# Patient Record
Sex: Female | Born: 1995 | Marital: Single | Smoking: Never smoker
Health system: Northeastern US, Academic
[De-identification: ages and names within clinical notes are randomized; demographics above are authoritative.]

## PROBLEM LIST (undated history)

## (undated) HISTORY — PX: FRACTURE SURGERY: SHX138

## (undated) HISTORY — PX: LEG SURGERY: SHX1003

## (undated) NOTE — ED Provider Notes (Signed)
Formatting of this note is different from the original.  Images from the original note were not included.    [facility]  Provider Note    CSN: [id number]  Arrival date & time: 12/17/18  2113    History    Chief Complaint  Chief Complaint   Patient presents with   ? Vaginal Pain     HPI  Brittney Dougherty is a 9 y.o. female presenting for evaluation of vaginal pain.    Patient states for the past 2 days, she has been having pain at the entrance to her vagina.  This began after she had vigorous sexual intercourse with her partner using a strap on.  Patient reports she is also having vaginal discharge.  She denies fevers, chills, nausea, vomiting, abdominal pain.  She denies urinary symptoms or abnormal bowel movements.  She denies history of similar.  Patient's last period was 2 months ago, but she recently receiving the Depakote shot, and her periods have not become regular.  Patient states she is sexually active with one female partner who is symptom free. She has not taken anything for her sxs. They care constant, nothing makes them better or worse.     HPI    History reviewed. No pertinent past medical history.    There are no active problems to display for this patient.    Past Surgical History:   Procedure Laterality Date   ? LEG SURGERY Left     ORIF with hardware due to fx       OB History    No obstetric history on file.      Home Medications      Prior to Admission medications    Medication Sig Start Date End Date Taking? Authorizing Provider   cephALEXin (KEFLEX) 500 MG capsule Take 1 capsule (500 mg total) by mouth 2 (two) times daily. 12/23/16   Doristine Devoid, PA-C   doxycycline (VIBRAMYCIN) 100 MG capsule Take 1 capsule (100 mg total) by mouth 2 (two) times daily. 12/23/16   Doristine Devoid, PA-C   methocarbamol (ROBAXIN) 500 MG tablet Take 1 tablet (500 mg total) by mouth 2 (two) times daily. 12/27/16   Muthersbaugh, Jarrett Soho, PA-C   omeprazole (PRILOSEC) 20 MG capsule  Take 1 capsule (20 mg total) by mouth daily. 12/23/16   Doristine Devoid, PA-C   ondansetron (ZOFRAN ODT) 4 MG disintegrating tablet 4mg  ODT q4 hours prn nausea/vomit 12/27/16   Muthersbaugh, Jarrett Soho, PA-C   ondansetron (ZOFRAN) 4 MG tablet Take 1 tablet (4 mg total) by mouth every 6 (six) hours. 12/23/16   Doristine Devoid, PA-C   sucralfate (CARAFATE) 1 GM/10ML suspension Take 10 mLs (1 g total) by mouth 4 (four) times daily -  with meals and at bedtime. 12/23/16   Doristine Devoid, PA-C     Family History  No family history on file.    Social History  Social History     Tobacco Use   ? Smoking status: Current Some Day Smoker   ? Smokeless tobacco: Never Used   Substance Use Topics   ? Alcohol use: Yes     Comment: occ   ? Drug use: Yes     Types: Marijuana     Allergies    Patient has no known allergies.    Review of Systems  Review of Systems   Genitourinary: Positive for vaginal discharge and vaginal pain.   All other systems reviewed and are negative.  Physical Exam  Updated Vital Signs  BP 135/73 (BP Location: Left Arm)   Pulse 86   Temp 98.2 F (36.8 C) (Oral)   Resp 18   Ht 5\' 4"  (1.626 m)   Wt 73 kg   LMP  (Approximate) Comment: Nov 2019-no depo inj that was due   SpO2 99%   BMI 27.64 kg/m     Physical Exam  Vitals signs and nursing note reviewed. Exam conducted with a chaperone present.   Constitutional:       General: She is not in acute distress.     Appearance: She is well-developed.      Comments: Young female laying comfortably in the bed in no acute distress   HENT:      Head: Normocephalic and atraumatic.   Eyes:      Conjunctiva/sclera: Conjunctivae normal.      Pupils: Pupils are equal, round, and reactive to light.   Neck:      Musculoskeletal: Normal range of motion and neck supple.   Cardiovascular:      Rate and Rhythm: Normal rate and regular rhythm.   Pulmonary:      Effort: Pulmonary effort is normal. No respiratory distress.      Breath sounds: Normal breath sounds. No  wheezing.   Abdominal:      General: There is no distension.      Palpations: Abdomen is soft.      Tenderness: There is no abdominal tenderness.      Comments: No tenderness palpation the abdomen.  Soft without rigidity, guarding or distention.  Negative rebound.   Genitourinary:     Cervix: Discharge present.      Uterus: Normal.       Adnexa: Right adnexa normal and left adnexa normal.      Comments: Chaperone present.  Swelling and tenderness of the inner labia.  No obvious tears or bruising.  No lesions noted.  Thin white discharge noted coming from the cervix with clumping white discharge adhered to the surface of the cervix.  No friability.  No CMT or adnexal tenderness.  Musculoskeletal: Normal range of motion.   Skin:     General: Skin is warm and dry.      Capillary Refill: Capillary refill takes less than 2 seconds.   Neurological:      Mental Status: She is alert and oriented to person, place, and time.     ED Treatments / Results   Labs  (all labs ordered are listed, but only abnormal results are displayed)  Labs Reviewed   WET PREP, GENITAL - Abnormal; Notable for the following components:       Result Value    WBC, Wet Prep HPF POC MANY (*)     All other components within normal limits   URINALYSIS, ROUTINE W REFLEX MICROSCOPIC - Abnormal; Notable for the following components:    APPearance CLOUDY (*)     Specific Gravity, Urine >1.030 (*)     Hgb urine dipstick TRACE (*)     Leukocytes, UA SMALL (*)     All other components within normal limits   URINALYSIS, MICROSCOPIC (REFLEX) - Abnormal; Notable for the following components:    Bacteria, UA MANY (*)     All other components within normal limits   PREGNANCY, URINE   GC/CHLAMYDIA PROBE AMP ([facility]) NOT AT [facility]     EKG  None    Radiology  No results found.    Procedures  Procedures (  including critical care time)    Medications Ordered in ED  Medications   cefTRIAXone (ROCEPHIN) injection 250 mg (250 mg Intramuscular Given 12/17/18 2319)    azithromycin (ZITHROMAX) tablet 1,000 mg (1,000 mg Oral Given 12/17/18 2320)     Initial Impression / Assessment and Plan / ED Course   I have reviewed the triage vital signs and the nursing notes.    Pertinent labs & imaging results that were available during my care of the patient were reviewed by me and considered in my medical decision making (see chart for details).        Presenting for evaluation of vaginal pain and irritation.  Physical exam reassuring, she is afebrile not tachycardic and appears nontoxic.  No abdominal tenderness.  On exam, patient without CMT or adnexal tenderness, low suspicion for PID, TOA, or torsion.  In her labia are swollen and irritated, consider trauma/from sex.  Patient also has significant discharge, will send for wet prep, gonorrhea, and chlamydia.    Urine with small leuks and many bacteria, but no nitrates.  Patient without urinary symptoms.  Wet prep negative for yeast, trichomoniasis, clue cells.  Shows many white cells.  As such, consider possible gonorrhea or chlamydia.  Also consider physiologic discharge.  Discussed findings with patient, who looks for treatment for gonorrhea and chlamydia today.  Discussed results will come back in several days, she will receive a phone call for positive.  If positive, she will need to encourage her partner to get treatment.  Encouraged tylenol, ibuprofen, cool compresses to help with pain and swelling of the labia.  Also discussed that this may be early signs of herpes, she develops lesions or ulcerations, she should follow-up with OB/GYN.  Encouraged follow-up if symptoms not improving.  At this time, patient appears safe for discharge.  Return precautions given.  Patient states she understands and agrees to plan.    Final Clinical Impressions(s) / ED Diagnoses     Final diagnoses:   Vaginal irritation   Vaginal discharge     ED Discharge Orders     None         Franchot Heidelberg, PA-C  12/18/18 0009      Maudie Flakes,  MD  12/18/18 1608    Electronically signed by Maudie Flakes, MD at 12/18/2018  4:08 PM EST    Associated attestation - Maudie Flakes, MD - 12/18/2018  4:08 PM EST  Formatting of this note might be different from the original.  Attestation: Medical screening examination/treatment/procedure(s) were performed by non-physician practitioner and as supervising physician I was immediately available for consultation/collaboration.    EKG: None

## (undated) NOTE — Progress Notes (Signed)
Formatting of this note might be different from the original.  Received message from Herschell Dimes that this student is cleared and does not need follow up  Electronically signed by Linzie Collin, RN at 10/01/2019 12:49 PM EDT

## (undated) NOTE — ED Triage Notes (Signed)
Formatting of this note might be different from the original.  C/o vaginal pain and d/c x 2 days-NAD-steady gait  Electronically signed by Andrey Spearman, RN at 12/17/2018  9:21 PM EST

---
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
## 2016-12-13 ENCOUNTER — Other Ambulatory Visit: Payer: Self-pay | Admitting: Student in an Organized Health Care Education/Training Program

## 2016-12-22 ENCOUNTER — Emergency Department (HOSPITAL_BASED_OUTPATIENT_CLINIC_OR_DEPARTMENT_OTHER)
Admission: EM | Admit: 2016-12-22 | Discharge: 2016-12-23 | Disposition: A | Discharge status: Home / Self Care | Payer: BLUE CROSS/BLUE SHIELD | Attending: Emergency Medicine | Admitting: Emergency Medicine

## 2016-12-22 ENCOUNTER — Encounter (HOSPITAL_BASED_OUTPATIENT_CLINIC_OR_DEPARTMENT_OTHER): Payer: Self-pay | Admitting: Emergency Medicine

## 2016-12-22 ENCOUNTER — Emergency Department (HOSPITAL_BASED_OUTPATIENT_CLINIC_OR_DEPARTMENT_OTHER): Payer: BLUE CROSS/BLUE SHIELD

## 2016-12-22 DIAGNOSIS — N898 Other specified noninflammatory disorders of vagina: Secondary | ICD-10-CM | POA: Insufficient documentation

## 2016-12-22 DIAGNOSIS — N3001 Acute cystitis with hematuria: Secondary | ICD-10-CM | POA: Diagnosis not present

## 2016-12-22 DIAGNOSIS — M545 Low back pain, unspecified: Secondary | ICD-10-CM

## 2016-12-22 DIAGNOSIS — R110 Nausea: Secondary | ICD-10-CM

## 2016-12-22 DIAGNOSIS — R1013 Epigastric pain: Secondary | ICD-10-CM | POA: Diagnosis not present

## 2016-12-22 LAB — URINALYSIS, ROUTINE W REFLEX MICROSCOPIC
BILIRUBIN URINE: NEGATIVE
Glucose, UA: NEGATIVE mg/dL
KETONES UR: NEGATIVE mg/dL
Nitrite: NEGATIVE
PROTEIN: NEGATIVE mg/dL
Specific Gravity, Urine: 1.026 (ref 1.005–1.030)
pH: 6 (ref 5.0–8.0)

## 2016-12-22 LAB — COMPREHENSIVE METABOLIC PANEL
ALBUMIN: 4.2 g/dL (ref 3.5–5.0)
ALK PHOS: 37 U/L — AB (ref 38–126)
ALT: 10 U/L — AB (ref 14–54)
AST: 26 U/L (ref 15–41)
Anion gap: 7 (ref 5–15)
BILIRUBIN TOTAL: 0.4 mg/dL (ref 0.3–1.2)
BUN: 11 mg/dL (ref 6–20)
CALCIUM: 8.9 mg/dL (ref 8.9–10.3)
CO2: 24 mmol/L (ref 22–32)
CREATININE: 0.82 mg/dL (ref 0.44–1.00)
Chloride: 106 mmol/L (ref 101–111)
GFR calc Af Amer: >60 mL/min (ref >60)
GFR calc non Af Amer: >60 mL/min (ref >60)
GLUCOSE: 87 mg/dL (ref 65–99)
Potassium: 3.8 mmol/L (ref 3.5–5.1)
SODIUM: 137 mmol/L (ref 135–145)
TOTAL PROTEIN: 7.2 g/dL (ref 6.5–8.1)

## 2016-12-22 LAB — WET PREP, GENITAL
Clue Cells Wet Prep HPF POC: NONE SEEN
SPERM: NONE SEEN
TRICH WET PREP: NONE SEEN
YEAST WET PREP: NONE SEEN

## 2016-12-22 LAB — CBC
HCT: 38 % (ref 36.0–46.0)
Hemoglobin: 12.9 g/dL (ref 12.0–15.0)
MCH: 31 pg (ref 26.0–34.0)
MCHC: 33.9 g/dL (ref 30.0–36.0)
MCV: 91.3 fL (ref 78.0–100.0)
Platelets: 214 10*3/uL (ref 150–400)
RBC: 4.16 MIL/uL (ref 3.87–5.11)
RDW: 12.3 % (ref 11.5–15.5)
WBC: 6.4 10*3/uL (ref 4.0–10.5)

## 2016-12-22 LAB — URINALYSIS, MICROSCOPIC (REFLEX)

## 2016-12-22 LAB — LIPASE, BLOOD: Lipase: 26 U/L (ref 11–51)

## 2016-12-22 LAB — PREGNANCY, URINE: PREG TEST UR: NEGATIVE

## 2016-12-22 MED ORDER — ONDANSETRON HCL 4 MG/2ML IJ SOLN
4.0000 mg | Freq: Once | INTRAMUSCULAR | Status: AC | PRN
  Administered 2016-12-22: 4 mg via INTRAVENOUS
  Filled 2016-12-22: qty 2

## 2016-12-22 MED ORDER — GI COCKTAIL ~~LOC~~
30.0000 mL | Freq: Once | ORAL | Status: AC
  Administered 2016-12-22: 30 mL via ORAL
  Filled 2016-12-22: qty 30

## 2016-12-22 MED ORDER — MORPHINE SULFATE (PF) 4 MG/ML IV SOLN
4.0000 mg | Freq: Once | INTRAVENOUS | Status: AC
  Administered 2016-12-22: 4 mg via INTRAVENOUS
  Filled 2016-12-22: qty 1

## 2016-12-22 MED ORDER — SODIUM CHLORIDE 0.9 % IV BOLUS (SEPSIS)
1000.0000 mL | Freq: Once | INTRAVENOUS | Status: AC
  Administered 2016-12-22: 1000 mL via INTRAVENOUS

## 2016-12-22 NOTE — ED Triage Notes (Signed)
Lower back pain x 4 months and fell on Friday . Was eval by MD for back pain and was told had muscle spasms. Epigastric pain x 2 days with nausea.

## 2016-12-22 NOTE — ED Provider Notes (Signed)
[facility]   CSN: [id number] Arrival date & time: 12/22/16  1934  By signing my name below, I, Octavia Heir, attest that this documentation has been prepared under the direction and in the presence of Rise Mu, PA-C.  Electronically Signed: Octavia Heir, ED Scribe. 12/22/16. 10:36 PM.    History   Chief Complaint Chief Complaint  Patient presents with  . Abdominal Pain   The history is provided by the patient. No language interpreter was used.   HPI Comments: Kim Frederick is a 21 y.o. female who presents to the Emergency Department presenting with multiple complaints. She expresses waxing and waning epigastric abdominal pain x 2 days. She describes her pain as a sharp sensation. There are no modifying or aggravating factors for her abdominal pain. Pt has no abdominal surgical hx. Pain is not associated with food. Nothing makes better or worse. She has not tried anything for the pain at home. Patient denies any fever, chills, headache, vision changes, lightheadedness, dizziness, chest pain, shortness of breath, change in bowel habits, numbness/tingling.  She further reports associated recurrent, chronic lower back pain x 4 months that was exacerbated after a fall occurred 3 days ago. She was seen by her MD in her hometown was was diagnosed with muscle spasms prior to her fall. Pt has been taking 3 ibuprofen q4h for the past 3 days without any relief. She expresses that the pain will be constant in her lower back and then radiate into her thoracic region. She denies diarrhea, blood in stool, melena, fever, vomiting, nocturnal hyperhidrosis, bladder or bowel incontinence, saddle paresthesias. Pt further denies IV drug use.   Pt also complains of abnormal vaginal discharge x 1 week. She reports associated urinary frequency and urinary urgency. Pt states her vaginal discharge is yellow which is abnormal. Pt is currently sexually active with one  partner and notes using protection every time with intercourse. She has no concern for STD exposure. She denies abnormal vaginal bleeding, hematuria and dysuria.  History reviewed. No pertinent past medical history.  There are no active problems to display for this patient.   Past Surgical History:  Procedure Laterality Date  . LEG SURGERY Left    ORIF with hardware due to fx    OB History    No data available       Home Medications    Prior to Admission medications   Not on File    Family History No family history on file.  Social History Social History  Substance Use Topics  . Smoking status: Never Smoker  . Smokeless tobacco: Never Used  . Alcohol use Yes     Comment: every other week     Allergies   Patient has no allergy information on record.   Review of Systems Review of Systems  Constitutional: Negative for chills and fever.  Gastrointestinal: Positive for abdominal pain and nausea. Negative for blood in stool, diarrhea and vomiting.  Genitourinary: Positive for frequency, urgency and vaginal discharge. Negative for dysuria, hematuria and vaginal bleeding.  Musculoskeletal: Positive for back pain (chronic).  All other systems reviewed and are negative.    Physical Exam Updated Vital Signs BP 105/72   Pulse 66   Temp 97.7 F (36.5 C) (Oral)   Resp 20   Ht 5\' 4"  (1.626 m)   Wt 140 lb (63.5 kg)   LMP 11/30/2016 (Approximate)   SpO2 100%   BMI 24.03 kg/m   Physical Exam  Constitutional: She is  oriented to person, place, and time. She appears well-developed and well-nourished.  The patient does not appear to be any acute distress. On my exam patient is laughing with friends in her room and playing on her cell phone.  HENT:  Head: Normocephalic and atraumatic.  Eyes: Conjunctivae and EOM are normal. Pupils are equal, round, and reactive to light.  Neck: Normal range of motion. Neck supple.  No midline C-spine tenderness. No deformity Was  noted. Full range of motion.  Cardiovascular: Normal rate, regular rhythm, normal heart sounds and intact distal pulses.   Pulmonary/Chest: Effort normal and breath sounds normal.  Abdominal: Soft. Bowel sounds are normal. She exhibits no distension. There is tenderness (easily distracted) in the epigastric area. There is no rigidity, no rebound, no guarding, no CVA tenderness, no tenderness at McBurney's point and negative Murphy's sign.  Genitourinary: There is no rash or tenderness on the right labia. There is no rash or tenderness on the left labia. Cervix exhibits motion tenderness (mild, pt reports feeling uncomfortable). Cervix exhibits no discharge and no friability. Right adnexum displays no mass and no tenderness. Left adnexum displays no mass and no tenderness. No erythema, tenderness or bleeding in the vagina. Vaginal discharge (thin, yellow, watery discharge ) found.  Genitourinary Comments: Chaperone was present for exam and patient tolerated the exam well.  Musculoskeletal: Normal range of motion. She exhibits tenderness.  Patient with midline L-spine and T-spine tenderness. No deformities or step-offs noted. Mild bilateral paraspinal tenderness of the lumbar region. No CVA tenderness. Full range of motion. Patient is ambulatory. Strength is 5 out of 5 in lower x-rays. Sensation intact. Cap refill normal. DP pulses are 2+ bilaterally.  Neurological: She is alert and oriented to person, place, and time.  Strength bilateral 5 in lower extremities. Sensation intact.  Skin: Skin is warm and dry. Capillary refill takes less than 2 seconds.  Psychiatric: She has a normal mood and affect.  Nursing note and vitals reviewed.    ED Treatments / Results  DIAGNOSTIC STUDIES: Oxygen Saturation is 100% on RA, normal by my interpretation.  COORDINATION OF CARE:  10:34 PM Discussed treatment plan with pt at bedside and pt agreed to plan.  Labs (all labs ordered are listed, but only abnormal  results are displayed) Labs Reviewed  WET PREP, GENITAL - Abnormal; Notable for the following:       Result Value   WBC, Wet Prep HPF POC MANY (*)    All other components within normal limits  URINALYSIS, ROUTINE W REFLEX MICROSCOPIC - Abnormal; Notable for the following:    APPearance CLOUDY (*)    Hgb urine dipstick MODERATE (*)    Leukocytes, UA MODERATE (*)    All other components within normal limits  URINALYSIS, MICROSCOPIC (REFLEX) - Abnormal; Notable for the following:    Bacteria, UA FEW (*)    Squamous Epithelial / LPF 0-5 (*)    All other components within normal limits  COMPREHENSIVE METABOLIC PANEL - Abnormal; Notable for the following:    ALT 10 (*)    Alkaline Phosphatase 37 (*)    All other components within normal limits  URINE CULTURE  PREGNANCY, URINE  LIPASE, BLOOD  CBC  GC/CHLAMYDIA PROBE AMP ([facility]) NOT AT [facility]    EKG  EKG Interpretation None       Radiology Dg Thoracic Spine 2 View  Result Date: 12/22/2016 CLINICAL DATA:  21 y/o F; 4 months of chronic back pain. Status post fall down stairs 3  days ago. EXAM: THORACIC SPINE 2 VIEWS COMPARISON:  None. FINDINGS: There is no evidence of thoracic spine fracture. Alignment is normal. No other significant bone abnormalities are identified. IMPRESSION: Negative. Electronically Signed   By: Mitzi Hansen M.D.   On: 12/22/2016 23:44   Dg Lumbar Spine Complete  Result Date: 12/22/2016 CLINICAL DATA:  Chronic back pain for 4 months EXAM: LUMBAR SPINE - COMPLETE 4+ VIEW COMPARISON:  None. FINDINGS: Normal alignment of lumbar vertebral bodies. No loss of vertebral body height or disc height. No pars fracture. No subluxation. IMPRESSION: No acute osseous abnormality. Electronically Signed   By: Genevive Bi M.D.   On: 12/22/2016 23:45    Procedures Procedures (including critical care time)  Medications Ordered in ED Medications  cefTRIAXone (ROCEPHIN) injection 250 mg (not administered)    lidocaine (PF) (XYLOCAINE) 1 % injection (not administered)  ondansetron (ZOFRAN) injection 4 mg (4 mg Intravenous Given 12/22/16 2213)  gi cocktail (Maalox,Lidocaine,Donnatal) (30 mLs Oral Given 12/22/16 2315)  sodium chloride 0.9 % bolus 1,000 mL (0 mLs Intravenous Stopped 12/23/16 0014)  morphine 4 MG/ML injection 4 mg (4 mg Intravenous Given 12/22/16 2315)  doxycycline (VIBRA-TABS) tablet 100 mg (100 mg Oral Given 12/23/16 0114)  cephALEXin (KEFLEX) capsule 500 mg (500 mg Oral Given 12/23/16 0112)     Initial Impression / Assessment and Plan / ED Course  I have reviewed the triage vital signs and the nursing notes.  Pertinent labs & imaging results that were available during my care of the patient were reviewed by me and considered in my medical decision making (see chart for details).    Patient presents to the ED with multiple complaints. Patient has complaint of lower chronic lumbar pain 4 months. Seen by primary care doctor and given muscle relaxers. Patient fell approximately 2 days ago reinjuring back. X-rays were negative for any acute fractures. Patient also reports vaginal discharge and urinary symptoms. Patient is sexually active. She denies any concern for STDs use protection. Urine consistent with UTI. Urine culture was sent and is pending. Wet prep shows no signs of Trichomonas or clue cells. WBCs were noted. GC and chlamydia cultures are pending. Patient with mild cervical motion tenderness. She does have yellow discharge. Given the pelvic exam findings will treat patient for PID. She was given Rocephin IM in the ED. She will be sent home with doxycycline twice a day for 2 weeks. Patient also be treated with Keflex for urinary tract infection. I have low suspicion for pyelonephritis given the patient is afebrile and not tachycardic. Pregnancy test negative in the ED. Encourage patient to follow-up with her primary care doctor for repeat urine in one week. Discussed with patient  differential diagnosis does include kidney stone. Low suspicion at this time. Patient also reports epigastric pain 2 days. No association with aggravating factors. Abdominal exam is benign in ED. She has no rebound. Negative Murphy sign or McBurney's point. Patient is afebrile and not tachycardic. Patient denies any chronic NSAID use or alcohol use. Denies any melena or hematochezia. All labs been unremarkable. No leukocytosis. Lipase is normal. LFTs are normal. Low suspicion for pancreatitis, cholecystitis, cold ductal lithiasis, cholangitis. We'll treat patient was short course of Zofran. Also given her short course of omeprazole and Carafate. Encouraged her follow up with her primary care doctor. Patient is nontoxic appearing. On my repeat exam she is laughing in the room with her friends and taxing her phone. Patient states her pain has improved with GI cocktail. Able to  tolerate po fluids. The patient's vital signs continue to be stable. Had conversation with patient concerning CAT scan of abdomen. Discussed risk and benefits and will hold at this time. Pt is hemodynamically stable, in NAD, & able to ambulate in the ED. Pain has been managed & has no complaints prior to dc. Pt is comfortable with above plan and is stable for discharge at this time. All questions were answered prior to disposition. Strict return precautions for f/u to the ED were discussed. Discussed patient with Dr. Fredderick PhenixBelfi who agrees with the above plan.   Final Clinical Impressions(s) / ED Diagnoses   Final diagnoses:  Acute bilateral low back pain without sciatica  Epigastric pain  Nausea  Acute cystitis with hematuria   I personally performed the services described in this documentation, which was scribed in my presence. The recorded information has been reviewed and is accurate.  New Prescriptions New Prescriptions   CEPHALEXIN (KEFLEX) 500 MG CAPSULE    Take 1 capsule (500 mg total) by mouth 2 (two) times daily.    DOXYCYCLINE (VIBRAMYCIN) 100 MG CAPSULE    Take 1 capsule (100 mg total) by mouth 2 (two) times daily.   OMEPRAZOLE (PRILOSEC) 20 MG CAPSULE    Take 1 capsule (20 mg total) by mouth daily.   ONDANSETRON (ZOFRAN) 4 MG TABLET    Take 1 tablet (4 mg total) by mouth every 6 (six) hours.   SUCRALFATE (CARAFATE) 1 GM/10ML SUSPENSION    Take 10 mLs (1 g total) by mouth 4 (four) times daily -  with meals and at bedtime.     Rise MuKenneth T Leaphart, PA-C 12/23/16 0130    Rise MuKenneth T Leaphart, PA-C 12/23/16 [id number]    Rolan BuccoMelanie Belfi, MD 12/24/16 1022

## 2016-12-23 MED ORDER — CEFTRIAXONE SODIUM 250 MG IJ SOLR
250.0000 mg | Freq: Once | INTRAMUSCULAR | Status: AC
  Administered 2016-12-23: 250 mg via INTRAMUSCULAR
  Filled 2016-12-23: qty 250

## 2016-12-23 MED ORDER — SUCRALFATE 1 GM/10ML PO SUSP: 1.0000 g | Freq: Three times a day (TID) | ORAL | 0 refills | Status: AC

## 2016-12-23 MED ORDER — OMEPRAZOLE 20 MG PO CPDR: 20.0000 mg | DELAYED_RELEASE_CAPSULE | Freq: Every day | ORAL | 0 refills | Status: AC

## 2016-12-23 MED ORDER — ONDANSETRON HCL 4 MG PO TABS: 4.0000 mg | ORAL_TABLET | Freq: Four times a day (QID) | ORAL | 0 refills | Status: AC

## 2016-12-23 MED ORDER — DOXYCYCLINE HYCLATE 100 MG PO TABS
100.0000 mg | ORAL_TABLET | Freq: Once | ORAL | Status: AC
  Administered 2016-12-23: 100 mg via ORAL
  Filled 2016-12-23: qty 1

## 2016-12-23 MED ORDER — DOXYCYCLINE HYCLATE 100 MG PO CAPS: 100.0000 mg | ORAL_CAPSULE | Freq: Two times a day (BID) | ORAL | 0 refills | Status: AC

## 2016-12-23 MED ORDER — CEPHALEXIN 500 MG PO CAPS: 500.0000 mg | ORAL_CAPSULE | Freq: Two times a day (BID) | ORAL | 0 refills | Status: AC

## 2016-12-23 MED ORDER — CEPHALEXIN 250 MG PO CAPS
500.0000 mg | ORAL_CAPSULE | Freq: Once | ORAL | Status: AC
  Administered 2016-12-23: 500 mg via ORAL
  Filled 2016-12-23: qty 2

## 2016-12-23 MED ORDER — LIDOCAINE HCL (PF) 1 % IJ SOLN
INTRAMUSCULAR | Status: AC
  Administered 2016-12-23: 0.9 mL
  Filled 2016-12-23: qty 5

## 2016-12-23 NOTE — Discharge Instructions (Signed)
Your urine shows signs of infection. I am given you a prescription for Keflex to take twice a day for 7 days. I have given you your first dose in the ED tonight. Please finish the full amount of antibiotics. I am also treating you for pelvic inflammatory disease. I am giving you a prescription for doxycycline. Please take 2 times a day for 14 days. I have given you your first dose in the ED tonight. Your gonorrhea and chlamydia cultures are pending. You will be notified of the results. She did have urine cultures pending and will be notified if need for change of antibiotics. Please follow-up with her primary care doctor or the health care Center on campus for repeat urine exam in one week to make sure that the blood is resolving. In terms of your abdominal pain I have given you a short course of Zofran for nausea. I am giving a prescription for Prilosec to take once a day for 7 days. I also given you a prescription for Carafate to take before meals. This will help with any symptoms of stomach ulcer. Avoid any ibuprofen or Advil. May take Tylenol for pain. Return to the ED if you develops any fevers, worsening vomiting, worsening abdominal pain or for any other reason. Please follow-up with the primary care doctor. I have given you a referral to a primary care doctor in the area.

## 2016-12-24 LAB — URINE CULTURE: Culture: <10000 — AB

## 2016-12-24 LAB — GC/CHLAMYDIA PROBE AMP (~~LOC~~) NOT AT ARMC
CHLAMYDIA, DNA PROBE: POSITIVE — AB
NEISSERIA GONORRHEA: NEGATIVE

## 2016-12-27 ENCOUNTER — Emergency Department (HOSPITAL_BASED_OUTPATIENT_CLINIC_OR_DEPARTMENT_OTHER): Payer: BLUE CROSS/BLUE SHIELD

## 2016-12-27 ENCOUNTER — Encounter (HOSPITAL_BASED_OUTPATIENT_CLINIC_OR_DEPARTMENT_OTHER): Payer: Self-pay | Admitting: *Deleted

## 2016-12-27 ENCOUNTER — Telehealth (HOSPITAL_BASED_OUTPATIENT_CLINIC_OR_DEPARTMENT_OTHER): Payer: Self-pay | Admitting: *Deleted

## 2016-12-27 ENCOUNTER — Emergency Department (HOSPITAL_BASED_OUTPATIENT_CLINIC_OR_DEPARTMENT_OTHER)
Admission: EM | Admit: 2016-12-27 | Discharge: 2016-12-27 | Disposition: A | Discharge status: Home / Self Care | Payer: BLUE CROSS/BLUE SHIELD | Attending: Emergency Medicine | Admitting: Emergency Medicine

## 2016-12-27 DIAGNOSIS — R1011 Right upper quadrant pain: Secondary | ICD-10-CM | POA: Insufficient documentation

## 2016-12-27 DIAGNOSIS — R112 Nausea with vomiting, unspecified: Secondary | ICD-10-CM | POA: Insufficient documentation

## 2016-12-27 DIAGNOSIS — Z79899 Other long term (current) drug therapy: Secondary | ICD-10-CM | POA: Insufficient documentation

## 2016-12-27 DIAGNOSIS — R1013 Epigastric pain: Secondary | ICD-10-CM | POA: Diagnosis present

## 2016-12-27 DIAGNOSIS — M549 Dorsalgia, unspecified: Secondary | ICD-10-CM | POA: Diagnosis not present

## 2016-12-27 LAB — CBC WITH DIFFERENTIAL/PLATELET
Basophils Absolute: 0 10*3/uL (ref 0.0–0.1)
Basophils Relative: 0 %
Eosinophils Absolute: 0 10*3/uL (ref 0.0–0.7)
Eosinophils Relative: 0 %
HEMATOCRIT: 37.6 % (ref 36.0–46.0)
HEMOGLOBIN: 12.8 g/dL (ref 12.0–15.0)
LYMPHS ABS: 1.3 10*3/uL (ref 0.7–4.0)
LYMPHS PCT: 28 %
MCH: 31 pg (ref 26.0–34.0)
MCHC: 34 g/dL (ref 30.0–36.0)
MCV: 91 fL (ref 78.0–100.0)
Monocytes Absolute: 0.5 10*3/uL (ref 0.1–1.0)
Monocytes Relative: 11 %
NEUTROS ABS: 2.8 10*3/uL (ref 1.7–7.7)
NEUTROS PCT: 61 %
Platelets: 201 10*3/uL (ref 150–400)
RBC: 4.13 MIL/uL (ref 3.87–5.11)
RDW: 12.2 % (ref 11.5–15.5)
WBC: 4.6 10*3/uL (ref 4.0–10.5)

## 2016-12-27 LAB — COMPREHENSIVE METABOLIC PANEL
ALT: 11 U/L — AB (ref 14–54)
AST: 28 U/L (ref 15–41)
Albumin: 4.2 g/dL (ref 3.5–5.0)
Alkaline Phosphatase: 43 U/L (ref 38–126)
Anion gap: 8 (ref 5–15)
BUN: 13 mg/dL (ref 6–20)
CALCIUM: 9 mg/dL (ref 8.9–10.3)
CO2: 25 mmol/L (ref 22–32)
CREATININE: 0.8 mg/dL (ref 0.44–1.00)
Chloride: 105 mmol/L (ref 101–111)
Glucose, Bld: 97 mg/dL (ref 65–99)
Potassium: 3.6 mmol/L (ref 3.5–5.1)
Sodium: 138 mmol/L (ref 135–145)
Total Bilirubin: 0.3 mg/dL (ref 0.3–1.2)
Total Protein: 7.3 g/dL (ref 6.5–8.1)

## 2016-12-27 LAB — LIPASE, BLOOD: LIPASE: 25 U/L (ref 11–51)

## 2016-12-27 MED ORDER — METHOCARBAMOL 500 MG PO TABS: 500.0000 mg | ORAL_TABLET | Freq: Two times a day (BID) | ORAL | 0 refills | Status: AC

## 2016-12-27 MED ORDER — SUCRALFATE 1 G PO TABS
1.0000 g | ORAL_TABLET | Freq: Once | ORAL | Status: AC
  Administered 2016-12-27: 1 g via ORAL
  Filled 2016-12-27: qty 1

## 2016-12-27 MED ORDER — ONDANSETRON 4 MG PO TBDP: ORAL_TABLET | ORAL | 0 refills | Status: AC

## 2016-12-27 MED ORDER — ONDANSETRON 4 MG PO TBDP
4.0000 mg | ORAL_TABLET | Freq: Once | ORAL | Status: AC
  Administered 2016-12-27: 4 mg via ORAL
  Filled 2016-12-27: qty 1

## 2016-12-27 MED FILL — ONDANSETRON ODT 4 MG TABLET: 4 | 1 days supply | Qty: 4 | Fill #0

## 2016-12-27 MED FILL — METHOCARBAMOL 500 MG TABLET: 500 | 10 days supply | Qty: 20 | Fill #0

## 2016-12-27 NOTE — Telephone Encounter (Signed)
Test results positive for Chlamydia. Chart reviewed by Dr. Particia NearingHaviland. No further treatment ordered. Pt called and informed of results. Advised to complete doxycycline regimen as prescribed, notify all sexual contacts, and abstain from sexual activity for minimum of 2 weeks. Pt verbalized understanding. [facility]

## 2016-12-27 NOTE — Discharge Instructions (Signed)
1. Medications: zofran for nausea, robaxin for low back pain, stop Keflex, continue doxycycline and omepazole,  2. Treatment: rest, drink plenty of fluids, advance diet slowly,  3. Follow Up: Please followup with your primary doctor in 2 days for discussion of your diagnoses and further evaluation after today's visit; if you do not have a primary care doctor use the resource guide provided to find one; Please return to the ER for persistent vomiting, high fevers or worsening symptoms

## 2016-12-27 NOTE — ED Triage Notes (Signed)
Abdominal pain.  She was treated for a UTI and PID a week ago.

## 2016-12-27 NOTE — ED Provider Notes (Signed)
[facility] Provider Note   CSN: [id number] Arrival date & time: 12/27/16  1225     History   Chief Complaint Chief Complaint  Patient presents with  . Abdominal Pain    HPI Kim Frederick is a 21 y.o. female with no major medical hx presents to the Emergency Department complaining of gradual, persistent, progressively worsening Epigastric abdominal pain onset 5 days ago. Patient presented to the emergency department at that time with multiple complaints. During her visit she was assessed for epigastric abdominal pain, spine and T-spine pain and vaginal discharge. UA appeared to have infection and patient was started on Keflex however culture resulted with less than 10,000 colonies. Patient also with thick vaginal discharge and cervical motion tenderness. She was treated for PID with doxycycline.  Her T-spine and L-spine films were without acute abnormality. Patient reports that she had been taking multiple doses of ibuprofen per day prior to the onset of her epigastric pain. She was discharged home with omeprazole and sucralfate. She reports that her epigastric pain has persisted. She reports that today after taking her doxycycline on an empty stomach she vomited and this increased her abdominal pain significantly. She now states she has 7/10 epigastric sharp abdominal pain. She has persistent nausea. She also reports worsening of her back pain which has been present for many months. Patient reports she's been taking Tylenol for her back pain but this is not helping.  She denies fever, chills, diarrhea, weakness, dizziness, syncope. She does report some constipation and reports blood on the tissue paper after straining for bowel movement. No melena or maroon-colored stools.  She also denies dark tarry stools. Emesis today was nonbloody and nonbilious.   The history is provided by the patient and medical records. No language interpreter was used.    History reviewed. No  pertinent past medical history.  There are no active problems to display for this patient.   Past Surgical History:  Procedure Laterality Date  . LEG SURGERY Left    ORIF with hardware due to fx    OB History    No data available       Home Medications    Prior to Admission medications   Medication Sig Start Date End Date Taking? Authorizing Provider  cephALEXin (KEFLEX) 500 MG capsule Take 1 capsule (500 mg total) by mouth 2 (two) times daily. 12/23/16   Rise Mu, PA-C  doxycycline (VIBRAMYCIN) 100 MG capsule Take 1 capsule (100 mg total) by mouth 2 (two) times daily. 12/23/16   Rise Mu, PA-C  methocarbamol (ROBAXIN) 500 MG tablet Take 1 tablet (500 mg total) by mouth 2 (two) times daily. 12/27/16   Milan Clare, PA-C  omeprazole (PRILOSEC) 20 MG capsule Take 1 capsule (20 mg total) by mouth daily. 12/23/16   Rise Mu, PA-C  ondansetron (ZOFRAN ODT) 4 MG disintegrating tablet 4mg  ODT q4 hours prn nausea/vomit 12/27/16   Lexander Tremblay, PA-C  ondansetron (ZOFRAN) 4 MG tablet Take 1 tablet (4 mg total) by mouth every 6 (six) hours. 12/23/16   Rise Mu, PA-C  sucralfate (CARAFATE) 1 GM/10ML suspension Take 10 mLs (1 g total) by mouth 4 (four) times daily -  with meals and at bedtime. 12/23/16   Rise Mu, PA-C    Family History No family history on file.  Social History Social History  Substance Use Topics  . Smoking status: Never Smoker  . Smokeless tobacco: Never Used  . Alcohol use Yes  Comment: every other week     Allergies   Patient has no known allergies.   Review of Systems Review of Systems  Gastrointestinal: Positive for abdominal pain, nausea and vomiting (NBNB).  Musculoskeletal: Positive for back pain.  All other systems reviewed and are negative.    Physical Exam Updated Vital Signs BP 121/69   Pulse 61   Temp 98.3 F (36.8 C) (Oral)   Resp 20   Ht 5\' 4"  (1.626 m)   Wt 63.5 kg   LMP  11/30/2016 (Approximate)   SpO2 100%   BMI 24.03 kg/m   Physical Exam  Constitutional: She appears well-developed and well-nourished. No distress.  Awake, alert, nontoxic appearance  HENT:  Head: Normocephalic and atraumatic.  Mouth/Throat: Oropharynx is clear and moist. No oropharyngeal exudate.  Eyes: Conjunctivae are normal. No scleral icterus.  Neck: Normal range of motion. Neck supple.  Full ROM without pain  Cardiovascular: Normal rate, regular rhythm and intact distal pulses.   Pulmonary/Chest: Effort normal and breath sounds normal. No respiratory distress. She has no wheezes.  Equal chest expansion  Abdominal: Soft. Bowel sounds are normal. She exhibits no distension and no mass. There is tenderness in the right upper quadrant and epigastric area. There is no rebound and no guarding.    Musculoskeletal: Normal range of motion. She exhibits no edema.  Full range of motion of the T-spine and L-spine No midline tenderness to the  T-spine or L-spine Tenderness to palpation of the paraspinous muscles of the T-spine and L-spine  Lymphadenopathy:    She has no cervical adenopathy.  Neurological: She is alert. She has normal reflexes.  Reflex Scores:      Bicep reflexes are 2+ on the right side and 2+ on the left side.      Brachioradialis reflexes are 2+ on the right side and 2+ on the left side.      Patellar reflexes are 2+ on the right side and 2+ on the left side.      Achilles reflexes are 2+ on the right side and 2+ on the left side. Speech is clear and goal oriented Moves extremities without ataxia  Skin: Skin is warm and dry. No rash noted. She is not diaphoretic. No erythema.  Psychiatric: She has a normal mood and affect. Her behavior is normal.  Nursing note and vitals reviewed.    ED Treatments / Results  Labs (all labs ordered are listed, but only abnormal results are displayed) Labs Reviewed  COMPREHENSIVE METABOLIC PANEL - Abnormal; Notable for the  following:       Result Value   ALT 11 (*)    All other components within normal limits  CBC WITH DIFFERENTIAL/PLATELET  LIPASE, BLOOD    Radiology [place] Abdomen Complete  Result Date: 12/27/2016 CLINICAL DATA:  C/o epigastric pain x 1 wk, nausea and vomiting started today. Is currently taking antibiotics for chlamydia dx'd this week. Family hx GB dz (mother, grandmother) EXAM: ABDOMEN ULTRASOUND COMPLETE COMPARISON:  None. FINDINGS: Gallbladder: No gallstones or wall thickening visualized. No sonographic Murphy sign noted by sonographer. Common bile duct: Diameter: 2 mm Liver: No focal lesion identified. Within normal limits in parenchymal echogenicity. IVC: No abnormality visualized. Pancreas: Visualized portion unremarkable. Spleen: Size and appearance within normal limits. Right Kidney: Length: 10 cm. Echogenicity within normal limits. No mass or hydronephrosis visualized. Left Kidney: Length: 9.9 cm. Echogenicity within normal limits. No mass or hydronephrosis visualized. Abdominal aorta: No aneurysm visualized. Other findings: None. IMPRESSION: Normal abdominal  ultrasound. Midline structures suboptimally visualized due to bowel gas. Electronically Signed   By: Amie Portland M.D.   On: 12/27/2016 13:56    Procedures Procedures (including critical care time)  Medications Ordered in ED Medications  sucralfate (CARAFATE) tablet 1 g (1 g Oral Given 12/27/16 1322)  ondansetron (ZOFRAN-ODT) disintegrating tablet 4 mg (4 mg Oral Given 12/27/16 1321)     Initial Impression / Assessment and Plan / ED Course  I have reviewed the triage vital signs and the nursing notes.  Pertinent labs & imaging results that were available during my care of the patient were reviewed by me and considered in my medical decision making (see chart for details).    Patient is nontoxic, nonseptic appearing, in no apparent distress.  Patient's pain and other symptoms adequately managed in emergency department. Labs,  imaging and vitals reviewed.  Patient had extensive workup several days ago. Patient does not meet the SIRS or Sepsis criteria.  On repeat exam patient does not have a surgical abdomin and there are no peritoneal signs.  Pt is currently being treated for PID, positive chlamydia culture.  Her pregnancy test several days ago was negative. Patient is afebrile and abdomen is soft and nontender in the lower aspects. Highly doubt tubo-ovarian abscess at this time. Her abdomen is tender in the epigastrium and right upper quadrant. Ultrasound without evidence of cholecystitis.  No indication of appendicitis, bowel obstruction, bowel perforation, cholecystitis, diverticulitis.  Patient is going to stop her legs due to normal culture. Discussed treating the with her doxycycline.  Patient with back pain or many months. No changes.  No neurological deficits and normal neuro exam.  Patient can walk but states is painful.  No loss of bowel or bladder control.  No concern for cauda equina.  No fever, night sweats, weight loss, h/o cancer, IVDU.  X-rays from previous visit reviewed and without acute abnormality. RICE protocol conservative therapy indicated and discussed with patient. She is to follow-up with her primary care provider for further evaluation.   Patient discharged home with symptomatic treatment and given strict instructions for follow-up with their primary care physician.  I have also discussed reasons to return immediately to the ER.  Patient expresses understanding and agrees with plan.    Final Clinical Impressions(s) / ED Diagnoses   Final diagnoses:  Epigastric abdominal pain  Non-intractable vomiting with nausea, unspecified vomiting type    New Prescriptions Discharge Medication List as of 12/27/2016  3:13 PM    START taking these medications   Details  methocarbamol (ROBAXIN) 500 MG tablet Take 1 tablet (500 mg total) by mouth 2 (two) times daily., Starting Fri 12/27/2016, Print      ondansetron (ZOFRAN ODT) 4 MG disintegrating tablet 4mg  ODT q4 hours prn nausea/vomit, Print         Dierdre Forth, PA-C 12/27/16 1722    Jacalyn Lefevre, MD 12/30/16 1500

## 2017-10-22 IMAGING — CR DG LUMBAR SPINE COMPLETE 4+V
5 series · 5 of 5 positions shown · non-contrast
Comparison: None.

CLINICAL DATA: Chronic back pain for 4 months

EXAM:
LUMBAR SPINE - COMPLETE 4+ VIEW

[t l-spine a.p.]
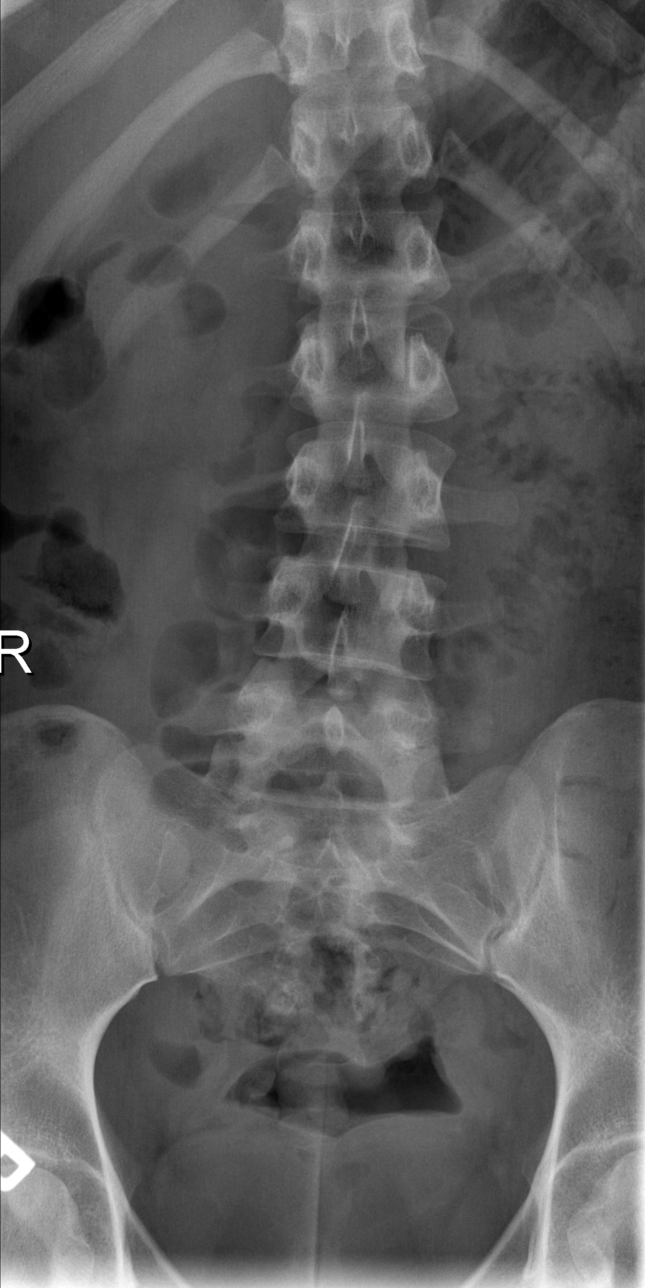

[t l-spine oblique exposure (1 of 2)]
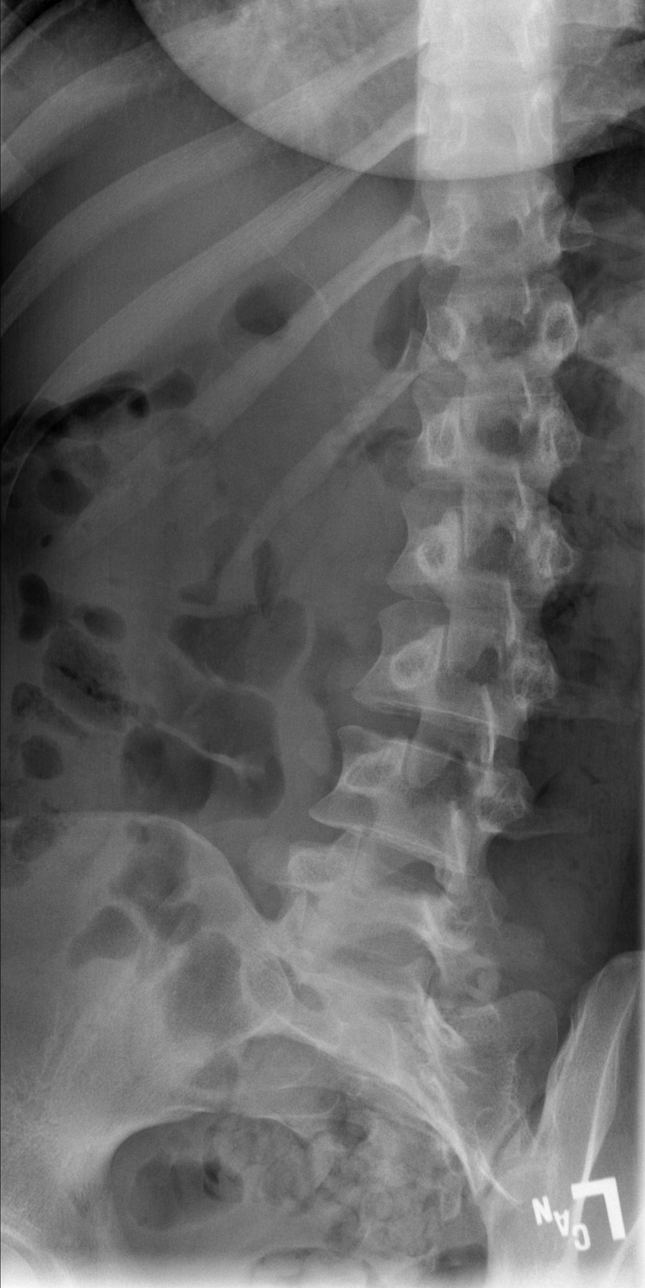

[t l-spine oblique exposure (2 of 2)]
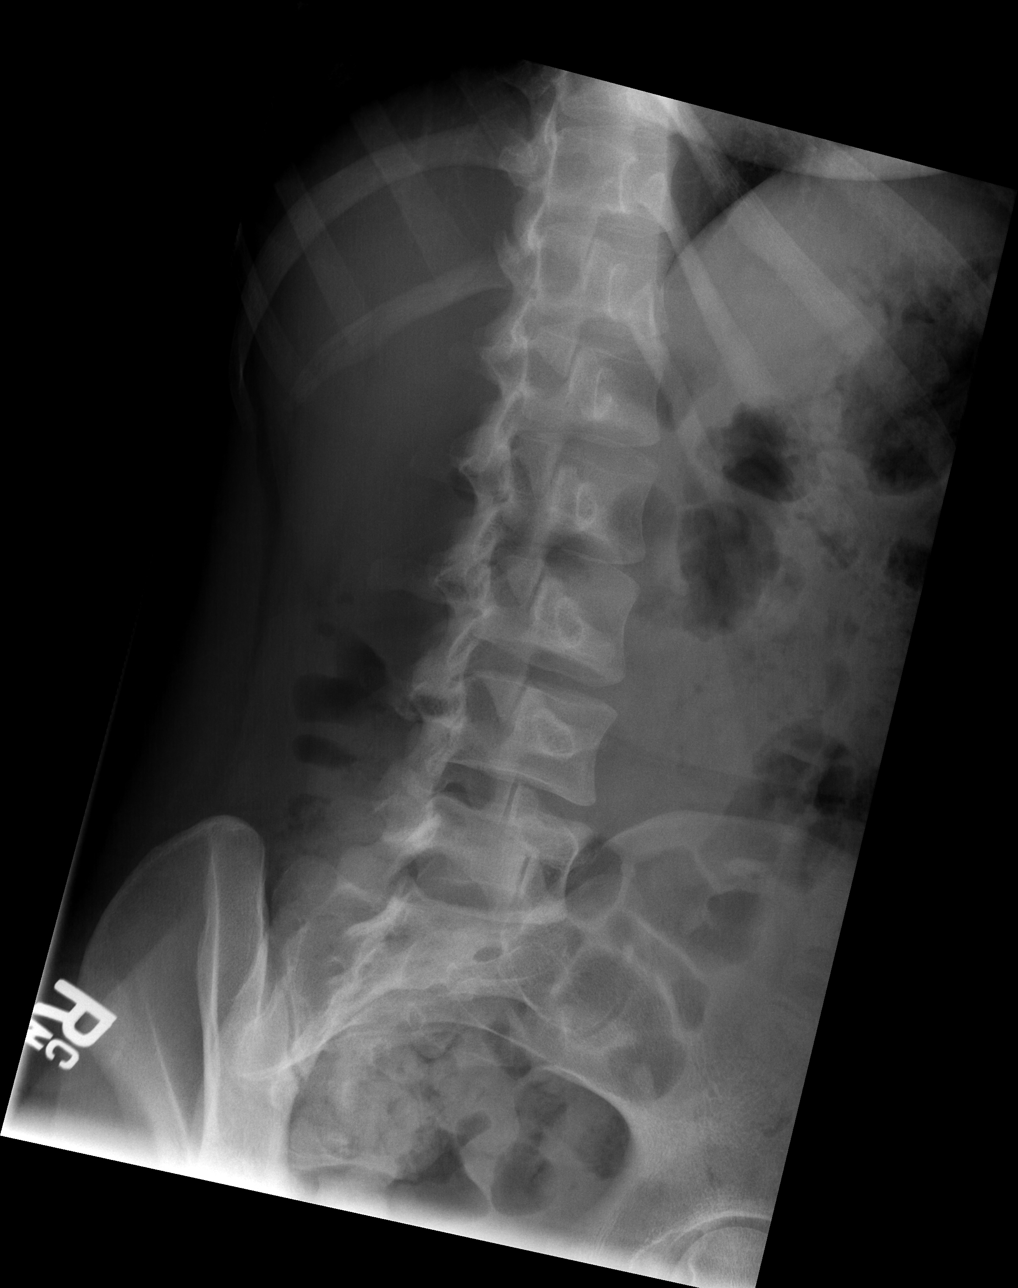

[t l-spine lat]
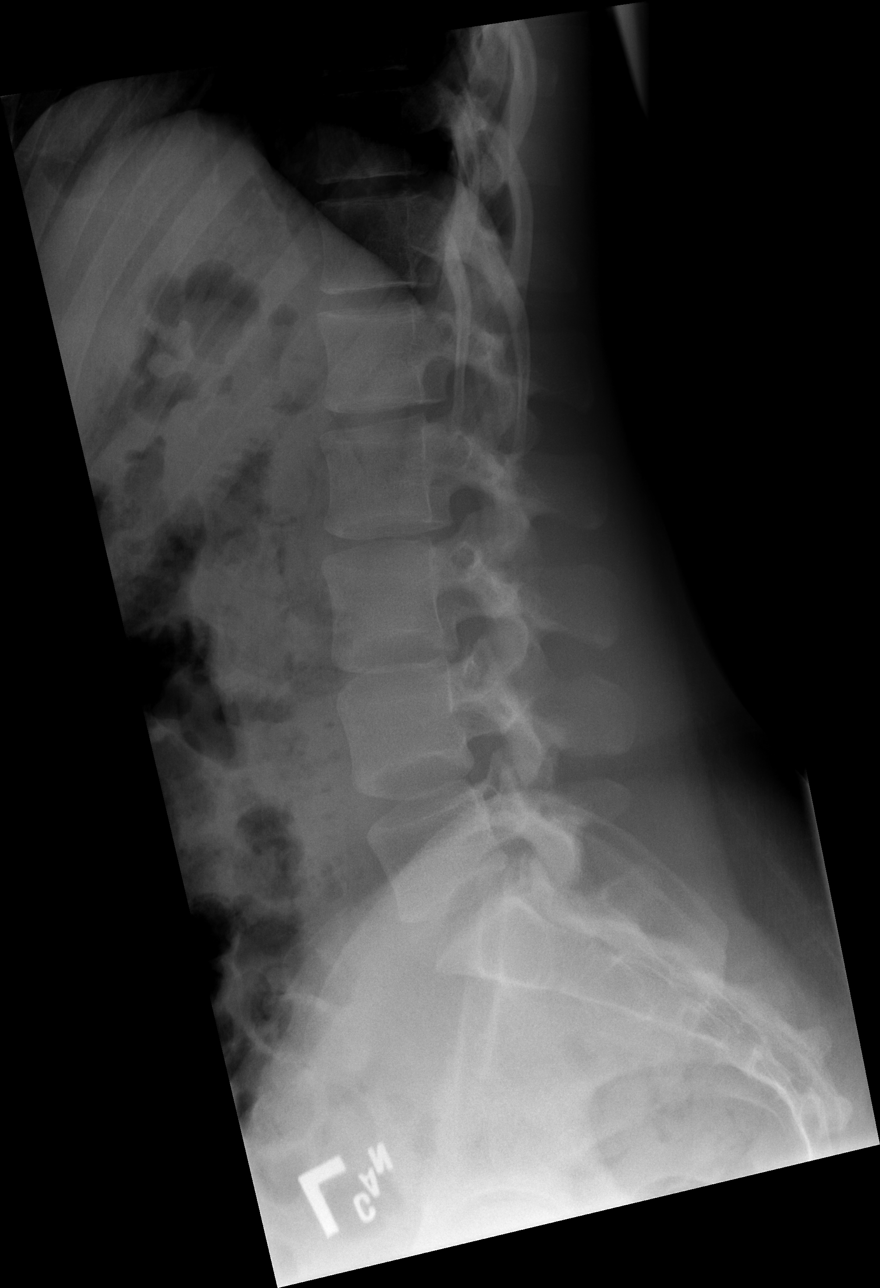

[t l-spine l5-s1 spot]
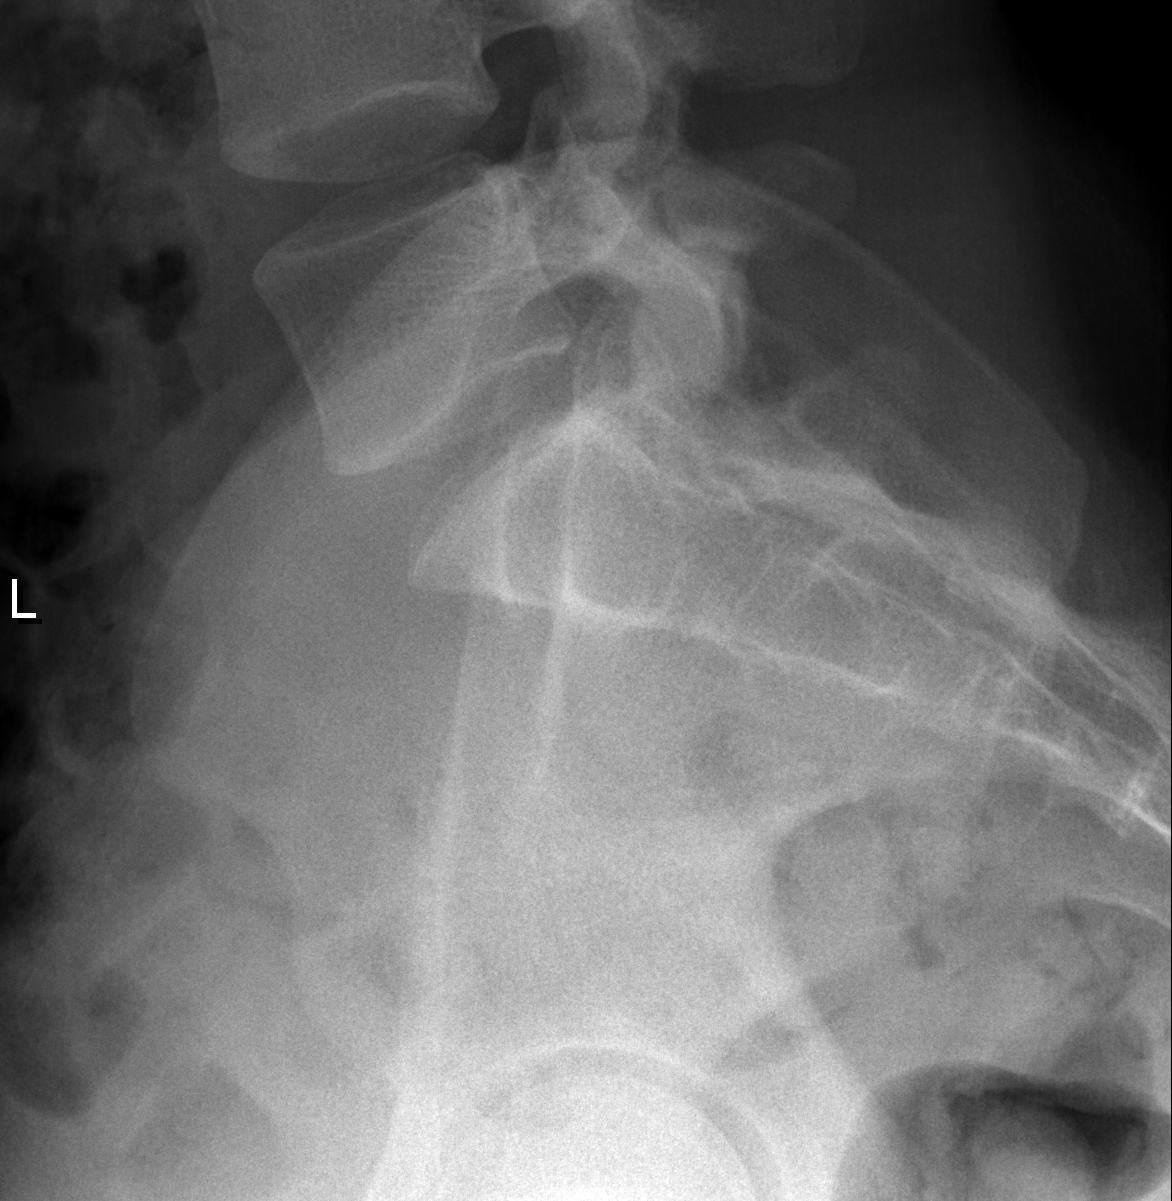

[5 of 5 positions shown; findings below may reference images not displayed]

FINDINGS: Normal alignment of lumbar vertebral bodies. No loss of vertebral
body height or disc height. No pars fracture. No subluxation.
IMPRESSION: No acute osseous abnormality.

## 2018-12-17 ENCOUNTER — Emergency Department (HOSPITAL_BASED_OUTPATIENT_CLINIC_OR_DEPARTMENT_OTHER)
Admission: EM | Admit: 2018-12-17 | Discharge: 2018-12-18 | Disposition: A | Discharge status: Home / Self Care | Payer: BLUE CROSS/BLUE SHIELD | Attending: Emergency Medicine | Admitting: Emergency Medicine

## 2018-12-17 ENCOUNTER — Encounter (HOSPITAL_BASED_OUTPATIENT_CLINIC_OR_DEPARTMENT_OTHER): Payer: Self-pay

## 2018-12-17 ENCOUNTER — Other Ambulatory Visit: Payer: Self-pay

## 2018-12-17 DIAGNOSIS — F17200 Nicotine dependence, unspecified, uncomplicated: Secondary | ICD-10-CM | POA: Diagnosis not present

## 2018-12-17 DIAGNOSIS — Z79899 Other long term (current) drug therapy: Secondary | ICD-10-CM | POA: Diagnosis not present

## 2018-12-17 DIAGNOSIS — N898 Other specified noninflammatory disorders of vagina: Secondary | ICD-10-CM | POA: Diagnosis not present

## 2018-12-17 DIAGNOSIS — R102 Pelvic and perineal pain: Secondary | ICD-10-CM | POA: Diagnosis present

## 2018-12-17 LAB — URINALYSIS, ROUTINE W REFLEX MICROSCOPIC
Bilirubin Urine: NEGATIVE
Glucose, UA: NEGATIVE mg/dL
Ketones, ur: NEGATIVE mg/dL
Nitrite: NEGATIVE
Protein, ur: NEGATIVE mg/dL
pH: 5.5 (ref 5.0–8.0)

## 2018-12-17 LAB — WET PREP, GENITAL
Clue Cells Wet Prep HPF POC: NONE SEEN
Sperm: NONE SEEN
TRICH WET PREP: NONE SEEN
Yeast Wet Prep HPF POC: NONE SEEN

## 2018-12-17 LAB — URINALYSIS, MICROSCOPIC (REFLEX)

## 2018-12-17 LAB — PREGNANCY, URINE: Preg Test, Ur: NEGATIVE

## 2018-12-17 MED ORDER — AZITHROMYCIN 250 MG PO TABS
1000.0000 mg | ORAL_TABLET | Freq: Once | ORAL | Status: AC
  Administered 2018-12-17: 1000 mg via ORAL
  Filled 2018-12-17: qty 4

## 2018-12-17 MED ORDER — CEFTRIAXONE SODIUM 250 MG IJ SOLR
250.0000 mg | Freq: Once | INTRAMUSCULAR | Status: AC
  Administered 2018-12-17: 250 mg via INTRAMUSCULAR
  Filled 2018-12-17: qty 250

## 2018-12-17 NOTE — ED Triage Notes (Signed)
C/o vaginal pain and d/c x 2 days-NAD-steady gait

## 2018-12-17 NOTE — Discharge Instructions (Signed)
Use a cool compress to help with vaginal swelling and pain. Abstain from sexual intercourse until your pain resolves.  Use Tylenol and ibuprofen as needed for pain. You treated for gonorrhea and chlamydia today.  The results are pending.  If positive, you will see the phone call.  You do not need further treatment, but you will need to let your partner know.  If negative, you will not receive a phone call. If you are still having symptoms or discharge, follow-up with OB/GYN.  There are 2 clinics listed below. If you develop skin lesions or ulcers, this may be a sign of herpes, and you will need to follow-up with OB/GYN for treatment. Return to the emergency room with any new, worsening, concerning symptoms.

## 2018-12-18 NOTE — ED Provider Notes (Signed)
[facility] EMERGENCY DEPARTMENT Provider Note   CSN: [id number] Arrival date & time: 12/17/18  2113     History   Chief Complaint Chief Complaint  Patient presents with  . Vaginal Pain    HPI Kim Frederick is a 23 y.o. female presenting for evaluation of vaginal pain.  Patient states for the past 2 days, she has been having pain at the entrance to her vagina.  This began after she had vigorous sexual intercourse with her partner using a strap on.  Patient reports she is also having vaginal discharge.  She denies fevers, chills, nausea, vomiting, abdominal pain.  She denies urinary symptoms or abnormal bowel movements.  She denies history of similar.  Patient's last period was 2 months ago, but she recently receiving the Depakote shot, and her periods have not become regular.  Patient states she is sexually active with one female partner who is symptom free. She has not taken anything for her sxs. They care constant, nothing makes them better or worse.   HPI  History reviewed. No pertinent past medical history.  There are no active problems to display for this patient.   Past Surgical History:  Procedure Laterality Date  . LEG SURGERY Left    ORIF with hardware due to fx     OB History   No obstetric history on file.      Home Medications    Prior to Admission medications   Medication Sig Start Date End Date Taking? Authorizing Provider  cephALEXin (KEFLEX) 500 MG capsule Take 1 capsule (500 mg total) by mouth 2 (two) times daily. 12/23/16   Rise MuLeaphart, Kenneth T, PA-C  doxycycline (VIBRAMYCIN) 100 MG capsule Take 1 capsule (100 mg total) by mouth 2 (two) times daily. 12/23/16   Rise MuLeaphart, Kenneth T, PA-C  methocarbamol (ROBAXIN) 500 MG tablet Take 1 tablet (500 mg total) by mouth 2 (two) times daily. 12/27/16   Muthersbaugh, Dahlia ClientHannah, PA-C  omeprazole (PRILOSEC) 20 MG capsule Take 1 capsule (20 mg total) by mouth daily. 12/23/16   Rise MuLeaphart, Kenneth T, PA-C    ondansetron (ZOFRAN ODT) 4 MG disintegrating tablet 4mg  ODT q4 hours prn nausea/vomit 12/27/16   Muthersbaugh, Dahlia ClientHannah, PA-C  ondansetron (ZOFRAN) 4 MG tablet Take 1 tablet (4 mg total) by mouth every 6 (six) hours. 12/23/16   Rise MuLeaphart, Kenneth T, PA-C  sucralfate (CARAFATE) 1 GM/10ML suspension Take 10 mLs (1 g total) by mouth 4 (four) times daily -  with meals and at bedtime. 12/23/16   Rise MuLeaphart, Kenneth T, PA-C    Family History No family history on file.  Social History Social History   Tobacco Use  . Smoking status: Current Some Day Smoker  . Smokeless tobacco: Never Used  Substance Use Topics  . Alcohol use: Yes    Comment: occ  . Drug use: Yes    Types: Marijuana     Allergies   Patient has no known allergies.   Review of Systems Review of Systems  Genitourinary: Positive for vaginal discharge and vaginal pain.  All other systems reviewed and are negative.    Physical Exam Updated Vital Signs BP 135/73 (BP Location: Left Arm)   Pulse 86   Temp 98.2 F (36.8 C) (Oral)   Resp 18   Ht 5\' 4"  (1.626 m)   Wt 73 kg   LMP  (Approximate) Comment: Nov 2019-no depo inj that was due   SpO2 99%   BMI 27.64 kg/m   Physical Exam Vitals signs and nursing  note reviewed. Exam conducted with a chaperone present.  Constitutional:      General: She is not in acute distress.    Appearance: She is well-developed.     Comments: Young female laying comfortably in the bed in no acute distress  HENT:     Head: Normocephalic and atraumatic.  Eyes:     Conjunctiva/sclera: Conjunctivae normal.     Pupils: Pupils are equal, round, and reactive to light.  Neck:     Musculoskeletal: Normal range of motion and neck supple.  Cardiovascular:     Rate and Rhythm: Normal rate and regular rhythm.  Pulmonary:     Effort: Pulmonary effort is normal. No respiratory distress.     Breath sounds: Normal breath sounds. No wheezing.  Abdominal:     General: There is no distension.      Palpations: Abdomen is soft.     Tenderness: There is no abdominal tenderness.     Comments: No tenderness palpation the abdomen.  Soft without rigidity, guarding or distention.  Negative rebound.  Genitourinary:    Cervix: Discharge present.     Uterus: Normal.      Adnexa: Right adnexa normal and left adnexa normal.     Comments: Chaperone present.  Swelling and tenderness of the inner labia.  No obvious tears or bruising.  No lesions noted.  Thin white discharge noted coming from the cervix with clumping white discharge adhered to the surface of the cervix.  No friability.  No CMT or adnexal tenderness. Musculoskeletal: Normal range of motion.  Skin:    General: Skin is warm and dry.     Capillary Refill: Capillary refill takes less than 2 seconds.  Neurological:     Mental Status: She is alert and oriented to person, place, and time.      ED Treatments / Results  Labs (all labs ordered are listed, but only abnormal results are displayed) Labs Reviewed  WET PREP, GENITAL - Abnormal; Notable for the following components:      Result Value   WBC, Wet Prep HPF POC MANY (*)    All other components within normal limits  URINALYSIS, ROUTINE W REFLEX MICROSCOPIC - Abnormal; Notable for the following components:   APPearance CLOUDY (*)    Specific Gravity, Urine >1.030 (*)    Hgb urine dipstick TRACE (*)    Leukocytes, UA SMALL (*)    All other components within normal limits  URINALYSIS, MICROSCOPIC (REFLEX) - Abnormal; Notable for the following components:   Bacteria, UA MANY (*)    All other components within normal limits  PREGNANCY, URINE  GC/CHLAMYDIA PROBE AMP ([place]) NOT AT [facility]    EKG None  Radiology No results found.  Procedures Procedures (including critical care time)  Medications Ordered in ED Medications  cefTRIAXone (ROCEPHIN) injection 250 mg (250 mg Intramuscular Given 12/17/18 2319)  azithromycin (ZITHROMAX) tablet 1,000 mg (1,000 mg Oral Given  12/17/18 2320)     Initial Impression / Assessment and Plan / ED Course  I have reviewed the triage vital signs and the nursing notes.  Pertinent labs & imaging results that were available during my care of the patient were reviewed by me and considered in my medical decision making (see chart for details).     Presenting for evaluation of vaginal pain and irritation.  Physical exam reassuring, she is afebrile not tachycardic and appears nontoxic.  No abdominal tenderness.  On exam, patient without CMT or adnexal tenderness, low suspicion for PID,  TOA, or torsion.  In her labia are swollen and irritated, consider trauma/from sex.  Patient also has significant discharge, will send for wet prep, gonorrhea, and chlamydia.  Urine with small leuks and many bacteria, but no nitrates.  Patient without urinary symptoms.  Wet prep negative for yeast, trichomoniasis, clue cells.  Shows many white cells.  As such, consider possible gonorrhea or chlamydia.  Also consider physiologic discharge.  Discussed findings with patient, who looks for treatment for gonorrhea and chlamydia today.  Discussed results will come back in several days, she will receive a phone call for positive.  If positive, she will need to encourage her partner to get treatment.  Encouraged tylenol, ibuprofen, cool compresses to help with pain and swelling of the labia.  Also discussed that this may be early signs of herpes, she develops lesions or ulcerations, she should follow-up with OB/GYN.  Encouraged follow-up if symptoms not improving.  At this time, patient appears safe for discharge.  Return precautions given.  Patient states she understands and agrees to plan.   Final Clinical Impressions(s) / ED Diagnoses   Final diagnoses:  Vaginal irritation  Vaginal discharge    ED Discharge Orders    None       Alveria ApleyCaccavale, Bernardino Dowell, PA-C 12/18/18 0009    Sabas SousBero, Michael M, MD 12/18/18 [phone]

## 2018-12-21 LAB — GC/CHLAMYDIA PROBE AMP (~~LOC~~) NOT AT ARMC
Chlamydia: NEGATIVE
Neisseria Gonorrhea: NEGATIVE

## 2020-07-10 ENCOUNTER — Ambulatory Visit
Admission: AD | Admit: 2020-07-10 | Discharge: 2020-07-10 | Disposition: A | Discharge status: Home / Self Care | Payer: PRIVATE HEALTH INSURANCE | Source: Ambulatory Visit | Attending: Emergency Medicine | Admitting: Emergency Medicine

## 2020-07-10 ENCOUNTER — Ambulatory Visit: Admit: 2020-07-10 | Discharge status: Absent without leave | Payer: Self-pay

## 2020-07-10 DIAGNOSIS — R300 Dysuria: Secondary | ICD-10-CM | POA: Insufficient documentation

## 2020-07-10 DIAGNOSIS — Z113 Encounter for screening for infections with a predominantly sexual mode of transmission: Secondary | ICD-10-CM

## 2020-07-10 DIAGNOSIS — N760 Acute vaginitis: Secondary | ICD-10-CM | POA: Insufficient documentation

## 2020-07-10 LAB — POCT URINALYSIS DIPSTICK
Bilirubin,Ur: NEGATIVE
Exp date: 53600802
Glucose,UA POCT: NORMAL mg/dL
Ketones,UA POCT: NEGATIVE mg/dL
Leuk Esterase,UA POCT: 2 — AB
Nitrite,UA POCT: NEGATIVE
PH,UA POCT: 8 (ref 5–8)
Specific gravity,UA POCT: 1 — AB (ref 1.002–1.030)
Urobilinogen,UA: NORMAL mg/dL

## 2020-07-10 LAB — POCT URINE PREGNANCY
Lot #: 166234
Preg Test,UR POC: NEGATIVE

## 2020-07-10 MED ORDER — CLINDAMYCIN HCL 300 MG PO CAPS *I*
300.0000 mg | ORAL_CAPSULE | Freq: Two times a day (BID) | ORAL | 0 refills | Status: AC
Start: 2020-07-10 — End: 2020-07-17

## 2020-07-10 NOTE — ED Triage Notes (Signed)
Pt c/o of increased frequency and burning with urination- she also states a foul odor. She denies exposure to STDs but would like to be tested for those.        Triage Note   Evette Cristal, LPN

## 2020-07-10 NOTE — Discharge Instructions (Signed)
Be sure to take a probiotic or yogurt daily, as you are taking strong antibiotics.     Drink a glass of fluid every hour while awake (water, juice, tea, etc.)    PLEASE REVIEW ALL INSTRUCTIONS FOR DETAILS AND CONTENT CONTAINED IN THE DISCHARGE MATERIALS NOT COVERED AT DISCHARGE.    Thank you Brittney Dougherty for coming to [facility] for your health care concerns.    If your condition changes and/or worsens please follow up with your primary doctor and/or return to the urgent care center.    In the event of an emergency please dial 911.

## 2020-07-10 NOTE — UC Provider Note (Signed)
History     Chief Complaint   Patient presents with    Urinary Symptoms    Sexually Transmitted Diseases     Pt with c/o 6 days of +urinary frequency, urgency and dysuria. No flank pain. No hematuria. Denies FCNVD or headache. Now with odor and increased vag discharge for the past 3 days. Wants to be sure not pregnant and no stds.     Has been drinking a lot of water, has not tried anything for this yet.     Normally, gets menses around the 20th of each month, last month, came on 06/07/20. No menses since.     Has lost appetite and breasts are sore, but no other current symptoms.     Has hx UTIs      History provided by:  Patient  Language interpreter used: No        Medical/Surgical/Family History     History reviewed. No pertinent past medical history.     There is no problem list on file for this patient.           History reviewed. No pertinent surgical history.  No family history on file.       Social History     Tobacco Use    Smoking status: Not on file   Substance Use Topics    Alcohol use: Not on file    Drug use: Not on file     Living Situation     Questions Responses    Patient lives with     Homeless     Caregiver for other family member     External Services     Employment     Domestic Violence Risk                 Review of Systems   Review of Systems   Constitutional: Negative for appetite change, chills, fatigue and fever.   Cardiovascular: Negative.    Gastrointestinal: Negative for abdominal pain, constipation, diarrhea, nausea and vomiting.   Genitourinary: Positive for dysuria, frequency, urgency and vaginal discharge. Negative for difficulty urinating, flank pain, hematuria and vaginal bleeding.   Musculoskeletal: Negative.    Neurological: Negative.  Negative for light-headedness and headaches.   Hematological: Negative.    Psychiatric/Behavioral: Negative.        Physical Exam   Triage Vitals  Triage Start: Start, (07/10/20 1648)   First Recorded BP: 118/78, Resp: 17, Temp: 36.4 C  (97.5 F) Oxygen Therapy SpO2: 98 %, O2 Device: None (Room air), Heart Rate: 70, (07/10/20 1746) Heart Rate (via Pulse Ox): 70, (07/10/20 1746).      Physical Exam  Vitals and nursing note reviewed.   Constitutional:       General: She is not in acute distress.     Appearance: Normal appearance. She is well-developed. She is not ill-appearing or toxic-appearing.   HENT:      Head: Normocephalic and atraumatic.      Right Ear: External ear normal.      Left Ear: External ear normal.   Eyes:      Conjunctiva/sclera: Conjunctivae normal.   Cardiovascular:      Rate and Rhythm: Normal rate.   Pulmonary:      Effort: Pulmonary effort is normal.   Abdominal:      General: Abdomen is flat. Bowel sounds are normal. There is no distension.      Palpations: Abdomen is soft. There is no mass.      Tenderness:  There is abdominal tenderness in the suprapubic area. There is no guarding or rebound.   Musculoskeletal:      Cervical back: Neck supple.   Skin:     General: Skin is warm and dry.      Capillary Refill: Capillary refill takes less than 2 seconds.   Neurological:      Mental Status: She is alert and oriented to person, place, and time.   Psychiatric:         Behavior: Behavior is cooperative.          Medical Decision Making        Medical Decision Making  Assessment:    Pt with 6 days of uti symptoms/and vaginal odor/discharge, concerned for BV and possible UTI. Has not taken anything for this yet.    Differential diagnosis:    Cystitis  UTI  Vaginal discharge  Vaginitis     Plan and Results:    Supportive measures given, OTC suggestions given, rx abx (see below) +/- pyridium per patient choice, f/u with pcp in 1 week,  sooner if worse. Urine dip shows red and white blood cells, cx sent.  Vaginitis screen done.     If this is a female patient of child bearing age, a Urine Pregnancy test has been ordered to guide diagnostics and/or medication therapy.   Encounter orders    Orders Placed This Encounter      Bacterial  urine culture      Chlamydia plasmid DNA amplification      N. Gonorrhoeae DNA amplification      Vaginitis screen: DNA probe      POCT urinalysis dipstick      POCT urine pregnancy      clindamycin (CLEOCIN) 300 MG capsule    Lab results   Recent Results (from the past 24 hour(s))  -POCT urinalysis dipstick  Collection Time: 07/10/20  5:18 PM       Result                                            Value                                                    Specific gravity,UA POCT                          1.000 (!)                                                PH,UA POCT                                        8.0  Drinda Butts POCT                             +2 (!)                                                   Nitrite,UA POCT                                   Negative                                                 Protein,UA POCT                                   Trace (!)                                                Glucose,UA POCT                                   Normal                                                   Ketones,UA POCT                                   Negative                                                 Urobilinogen,UA                                   Normal                                                   Bilirubin,Ur                                      Negative                                                 Blood,UA POCT  About 50 (!)                                             Exp date                                          [id number]                                                 Lot #                                             08/31/2021                                          -POCT urine pregnancy  Collection Time: 07/10/20  5:19 PM       Result                                            Value                                                    Preg Test,UR POC                                   Negative                                                 INTERNAL CONTROL POCT URINE PREGNANCY             *Yes-internal procedural control(s) acceptable           Exp date                                          08/31/2021                                               Lot #                                             [id number]  Independent Review of: existing labs/imaging and chart/prior records    Diagnosis and Disposition:       Return precautions discussed and provided on AVS.    Discharge Medications  Current Discharge Medication List    New Medications    clindamycin (CLEOCIN) 300 mg  Dose: 300 mg  Take 300 mg by mouth 2 times daily  Quantity 14 capsule Refill 0  Start date: 07/10/2020, End date: 07/17/2020        Follow-up  @AVSFOLLOWUP @  Patient Instructions   .       Be sure to take a probiotic or yogurt daily, as you are taking strong   antibiotics.     Drink a glass of fluid every hour while awake (water, juice, tea, etc.)    PLEASE REVIEW ALL INSTRUCTIONS FOR DETAILS AND CONTENT CONTAINED IN THE   DISCHARGE MATERIALS NOT COVERED AT DISCHARGE.    Thank you for coming to [facility] for your health   care concerns.    If your condition changes and/or worsens please follow up with your   primary doctor and/or return to the urgent care center.    In the event of an emergency please dial 911.       Final Diagnosis  Final diagnoses:   [N76.0] Acute vaginitis (Primary)   [R30.0] Dysuria         Clide Cliff, MD              Maxcine Ham, MD  07/10/20 1800

## 2020-07-11 LAB — N. GONORRHOEAE DNA AMPLIFICATION: N. gonorrhoeae DNA Amplification: 0

## 2020-07-11 LAB — AEROBIC CULTURE: Aerobic Culture: 0

## 2020-07-11 LAB — VAGINITIS SCREEN: DNA PROBE
Vaginitis Screen:DNA Probe: POSITIVE — AB
Vaginitis Screen:DNA Probe: POSITIVE — AB

## 2020-07-11 LAB — CHLAMYDIA PLASMID DNA AMPLIFICATION: Chlamydia Plasmid DNA Amplification: 0

## 2020-07-12 ENCOUNTER — Telehealth: Payer: Self-pay | Admitting: Internal Medicine

## 2020-07-12 MED ORDER — FLUCONAZOLE 150 MG PO TABS *I*
150.0000 mg | ORAL_TABLET | Freq: Once | ORAL | 0 refills | Status: AC
Start: 2020-07-12 — End: 2020-07-12

## 2020-07-12 NOTE — Telephone Encounter (Signed)
[facility] ED/UC Post-Visit Patient Call     Visit location: [place] [facility]                     Date of service: 07/10/20    Contact made: 07/12/2020    Results of Post-Visit Patient Call: patient agrees to briefly discuss recent visit     If we provided you with a prescription were you able to fill it?:yes    If testing was ordered, were results discussed? Yes-discussed with patient vaginitis swab positive for Candida and BV.  Treat appropriately with clindamycin at [facility] for BV, will send fluconazole.  No further questions    If we provided you with a referral, were you able to make an appointment?:N/A    Do you have any questions for the RN/Provider regarding your care?: No    Do you have any concerns or would you like to be contacted regarding your visit?:No    Lester Carolina, NP on 07/12/2020 at 2:03 PM       Lester Carolina, NP  07/12/20 1404

## 2020-07-25 ENCOUNTER — Ambulatory Visit
Admission: AD | Admit: 2020-07-25 | Discharge: 2020-07-25 | Disposition: A | Discharge status: Home / Self Care | Payer: PRIVATE HEALTH INSURANCE | Source: Ambulatory Visit | Attending: Vascular Surgery | Admitting: Vascular Surgery

## 2020-07-25 DIAGNOSIS — N3001 Acute cystitis with hematuria: Secondary | ICD-10-CM | POA: Insufficient documentation

## 2020-07-25 DIAGNOSIS — Z3202 Encounter for pregnancy test, result negative: Secondary | ICD-10-CM

## 2020-07-25 LAB — POCT URINE PREGNANCY
Lot #: 166234
Preg Test,UR POC: NEGATIVE

## 2020-07-25 LAB — POCT URINALYSIS DIPSTICK
Bilirubin,Ur: NEGATIVE
Glucose,UA POCT: NORMAL mg/dL
Ketones,UA POCT: NEGATIVE mg/dL
Leuk Esterase,UA POCT: 1 — AB
Lot #: 53600802
Nitrite,UA POCT: NEGATIVE
PH,UA POCT: 5 (ref 5–8)
Specific gravity,UA POCT: 1.03 (ref 1.002–1.030)
Urobilinogen,UA: NORMAL mg/dL

## 2020-07-25 MED ORDER — NITROFURANTOIN MONOHYD MACRO 100 MG PO CAPS *I*
100.0000 mg | ORAL_CAPSULE | Freq: Two times a day (BID) | ORAL | 0 refills | Status: AC
Start: 2020-07-25 — End: 2020-07-30

## 2020-07-25 MED ORDER — FLUCONAZOLE 150 MG PO TABS *I*
150.0000 mg | ORAL_TABLET | Freq: Once | ORAL | 0 refills | Status: AC
Start: 2020-07-25 — End: 2020-07-25

## 2020-07-25 NOTE — Discharge Instructions (Signed)
You were seen today for a urinary tract infection.      You have been prescribed an antibiotic called Macrobid.  Please take all of the medication provided until gone despite if you are feeling better.    Drink enough water and fluids to keep your urine clear or pale yellow color, adding cranberry juice to your fluid to help clear infection.  Avoid caffeine, tea, and carbonated beverages because they tend to irritate your bladder.  Empty your bladder often and avoid holding urine for long periods of time.  Empty your bladder before and after sexual intercourse to prevent further UTI’s  After a bowel movement, women should cleanse from front to back. Use each tissue only once.    You may take Tylenol 650 mg every 4-6 hours as needed for pain/fever.  Maximum daily dose = 3000 mg.  You may use over-the-counter AZO for relief of burning with urination.    If you are on birth control pills, please use a back-up method of protection for the next 4 weeks, as taking antibiotics can reduce the effectiveness of your birth control pills.      If you develop any new or worsening pain radiating to the lower back, fever, chills, abdominal pain or vomiting go to the Emergency Department.

## 2020-07-25 NOTE — UC Provider Note (Signed)
History     Chief Complaint   Patient presents with    Urinary Tract Infection     Patient c/o pain with urination and fould smelling urine that started today. She also reports blood in her urine this afternoon,      Brittney Dougherty is a 24 y.o. female who presents with complaints of vaginal irritation, dysuria, urgency, frequency, malodorous urine, hematuria which started yesterday.  She has not tried any interventions for her symptoms.  She states she has had many urinary tract infections in the past, this feels similar.  Recently completed treatment for yeast infection after having been on antibiotics for a urinary tract infection.  She currently denies HA, fever, chills, N/V, abdominal pain, flank pain, malodorous discharge, changes in vaginal discharge, concern for STI.      History provided by:  Patient  Language interpreter used: No        Medical/Surgical/Family History     History reviewed. No pertinent past medical history.     There is no problem list on file for this patient.           History reviewed. No pertinent surgical history.  No family history on file.       Social History     Tobacco Use    Smoking status: Not on file   Substance Use Topics    Alcohol use: Not on file    Drug use: Not on file     Living Situation     Questions Responses    Patient lives with     Homeless     Caregiver for other family member     External Services     Employment     Domestic Violence Risk                 Review of Systems   Review of Systems   Constitutional: Negative for chills, fatigue and fever.   Respiratory: Negative for cough and shortness of breath.    Cardiovascular: Negative for chest pain.   Gastrointestinal: Negative for abdominal pain, diarrhea, nausea and vomiting.   Endocrine: Negative for polydipsia, polyphagia and polyuria.   Genitourinary: Positive for dysuria, frequency, hematuria, urgency and vaginal pain. Negative for decreased urine volume, difficulty urinating, dyspareunia, flank  pain, genital sores, menstrual problem, pelvic pain, vaginal bleeding and vaginal discharge.   Musculoskeletal: Negative for arthralgias and myalgias.   Skin: Negative for rash and wound.   Allergic/Immunologic: Negative for environmental allergies and food allergies.   Neurological: Negative for dizziness and headaches.       Physical Exam   Triage Vitals  Triage Start: Start, (07/25/20 1718)   First Recorded BP: 149/70, Temp: 35.9 C (96.6 F), Temp src: TEMPORAL Oxygen Therapy SpO2: 97 %, Oximetry Source: Rt Hand, O2 Device: None (Room air), Heart Rate: 92, (07/25/20 1712)  .      Physical Exam  Vitals and nursing note reviewed.   Constitutional:       General: She is not in acute distress.     Appearance: Normal appearance. She is normal weight. She is not ill-appearing or toxic-appearing.   HENT:      Head: Normocephalic.      Mouth/Throat:      Mouth: Mucous membranes are moist.   Eyes:      Extraocular Movements: Extraocular movements intact.   Cardiovascular:      Rate and Rhythm: Normal rate and regular rhythm.      Pulses: Normal pulses.  Heart sounds: Normal heart sounds. No friction rub.   Pulmonary:      Effort: Pulmonary effort is normal.      Breath sounds: Normal breath sounds.   Abdominal:      General: Abdomen is flat. Bowel sounds are normal. There is no distension.      Tenderness: There is no abdominal tenderness. There is no right CVA tenderness, left CVA tenderness or guarding.   Musculoskeletal:         General: Normal range of motion.      Cervical back: Normal range of motion.   Skin:     General: Skin is warm.      Capillary Refill: Capillary refill takes less than 2 seconds.   Neurological:      General: No focal deficit present.      Mental Status: She is alert.   Psychiatric:         Mood and Affect: Mood normal.          Medical Decision Making        Medical Decision Making  Assessment:    Patient is a 24 year old female who presents with 2 days of dysuria, malodorous urine,  urgency, frequency, hematuria.  She is overall well-appearing, her vitals are stable.  No abdominal or CVA tenderness noted on exam.  UA with +1 leuk esterase, negative for nitrites, 250 of blood.    Pregnancy - Urine Pregnancy test ordered to guide diagnostics and/or medication therapy in patient of child bearing age      Differential diagnosis:    Acute cystitis  Pyelonephritis, unlikely  Nephrolithiasis, unlikely  Interstitial cystitis  Hyperactive bladder with incomplete emptying    Independent Review of: existing labs/imaging and chart/prior records    Plan:   -Given patient symptoms and UA findings will treat for acute cystitis with Macrobid  -Provided with x1 dose of Diflucan given recent concurrent yeast infection with antibiotic usage  -We will send urine for culture  -Discussed supportive measures and return precautions, patient verbalizes understanding    Orders Placed This Encounter    Bacterial urine culture    POCT urinalysis dipstick    POCT urine pregnancy    fluconazole (DIFLUCAN) 150 MG tablet    nitrofurantoin monohydrate macrocrystal (MACROBID) 100 MG capsule       Recent Results (from the past 24 hour(s))   Bacterial urine culture    Collection Time: 07/25/20  5:38 PM    Specimen: Urine (Clean catch, voided, midstream)   Result Value Ref Range    Aerobic Culture .    POCT urinalysis dipstick    Collection Time: 07/25/20  5:38 PM   Result Value Ref Range    Specific gravity,UA POCT 1.030 1.002 - 1.030    PH,UA POCT 5.0 5 - 8    Leuk Esterase,UA POCT +1 (!) Negative    Nitrite,UA POCT Negative Negative    Protein,UA POCT + 30mg /dL (!) Negative mg/dL    Glucose,UA POCT Normal Normal mg/dL    Ketones,UA POCT Negative Negative mg/dL    Urobilinogen,UA Normal Less than 1 mg/dL    Bilirubin,Ur Negative Negative, Test Not Performed    Blood,UA POCT About 250 (!) Negative    Exp date 08/31/2021     Lot # [id number]    POCT urine pregnancy    Collection Time: 07/25/20  5:39 PM   Result Value Ref Range     Preg Test,UR POC Negative Negative-Dilute urine specimens may cause false negative urine pregnancy  results...    INTERNAL CONTROL POCT URINE PREGNANCY *Yes-internal procedural control(s) acceptable     Exp date 08/31/2021     Lot # [id number]            Final Diagnosis    ICD-10-CM ICD-9-CM   1. Acute cystitis with hematuria  N30.01 595.0       Encourage fluids, encourage rest, good hand hygiene.    Use over the counter medications as discussed.    Please start the new medications as below:    Current Discharge Medication List      New Medications    Details Last Dose Given Next Dose Due Script Given?   nitrofurantoin monohydrate macrocrystal (MACROBID) 100 mg Dose: 100 mg  Take 100 mg by mouth 2 times daily  Quantity 10 capsule, Refill 0  Start date: 07/25/2020, End date: 07/30/2020       Comments: Emergency Encounter                   Please follow up with your physician as below:    Follow-up Information     Go to  [facility] [place].    Specialty: Emergency Medicine  Why: If symptoms worsen  Contact information:  [address]  [place] [address]  [phone]                   Discharge Instructions       You were seen today for a urinary tract infection.      You have been prescribed an antibiotic called Macrobid.  Please take all of the medication provided until gone despite if you are feeling better.    Drink enough water and fluids to keep your urine clear or pale yellow color, adding cranberry juice to your fluid to help clear infection.  Avoid caffeine, tea, and carbonated beverages because they tend to irritate your bladder.  Empty your bladder often and avoid holding urine for long periods of time.  Empty your bladder before and after sexual intercourse to prevent further UTIs  After a bowel movement, women should cleanse from front to back. Use each tissue only once.    You may take Tylenol 650 mg every 4-6 hours as needed for pain/fever.  Maximum daily dose = 3000 mg.  You may  use over-the-counter AZO for relief of burning with urination.    If you are on birth control pills, please use a back-up method of protection for the next 4 weeks, as taking antibiotics can reduce the effectiveness of your birth control pills.      If you develop any new or worsening pain radiating to the lower back, fever, chills, abdominal pain or vomiting go to the Emergency Department.          Final Diagnosis  Final diagnoses:   [N30.01] Acute cystitis with hematuria (Primary)         Lind Covert, NP              Lind Covert, NP  07/26/20 1003

## 2020-07-25 NOTE — ED Triage Notes (Signed)
Patient c/o pain with urination and fould smelling urine that started today. She also reports blood in her urine this afternoon,        Triage Note   Salomon Fick, RN

## 2020-07-26 ENCOUNTER — Encounter: Payer: Self-pay | Admitting: Vascular Surgery

## 2020-07-27 LAB — AEROBIC CULTURE

## 2020-08-24 ENCOUNTER — Ambulatory Visit
Admission: AD | Admit: 2020-08-24 | Discharge: 2020-08-24 | Disposition: A | Discharge status: Home / Self Care | Payer: PRIVATE HEALTH INSURANCE | Source: Ambulatory Visit | Attending: Emergency Medicine | Admitting: Emergency Medicine

## 2020-08-24 DIAGNOSIS — N3001 Acute cystitis with hematuria: Secondary | ICD-10-CM | POA: Insufficient documentation

## 2020-08-24 DIAGNOSIS — Z113 Encounter for screening for infections with a predominantly sexual mode of transmission: Secondary | ICD-10-CM

## 2020-08-24 LAB — POCT URINE PREGNANCY
Lot #: 166234
Preg Test,UR POC: NEGATIVE

## 2020-08-24 LAB — POCT URINALYSIS DIPSTICK
Bilirubin,Ur: NEGATIVE
Glucose,UA POCT: NORMAL mg/dL
Leuk Esterase,UA POCT: 2 — AB
Lot #: 53600802
Nitrite,UA POCT: POSITIVE — AB
PH,UA POCT: 5 (ref 5–8)
Specific gravity,UA POCT: 1.03 (ref 1.002–1.030)
Urobilinogen,UA: NORMAL mg/dL

## 2020-08-24 MED ORDER — PHENAZOPYRIDINE HCL 200 MG PO TABS *I*
200.0000 mg | ORAL_TABLET | Freq: Three times a day (TID) | ORAL | 0 refills | Status: AC | PRN
Start: 2020-08-24 — End: 2020-08-26

## 2020-08-24 MED ORDER — NITROFURANTOIN MONOHYD MACRO 100 MG PO CAPS *I*
100.0000 mg | ORAL_CAPSULE | Freq: Two times a day (BID) | ORAL | 0 refills | Status: DC
Start: 2020-08-24 — End: 2020-08-27

## 2020-08-24 NOTE — Discharge Instructions (Signed)
Drink plenty of fluids. Get plenty of rest. Use good hand hygiene.

## 2020-08-24 NOTE — ED Triage Notes (Signed)
Patient c/o pain and increased frequency that started early this morning. Patient also asking for STD check.        Triage Note   Salomon Fick, RN

## 2020-08-24 NOTE — UC Provider Note (Signed)
History     Chief Complaint   Patient presents with    Urinary Tract Infection     Patient c/o pain and increased frequency that started early this morning. Patient also asking for STD check.     Sexually Transmitted Diseases       History provided by:  Patient  Language interpreter used: No    Dysuria  Pain quality:  Burning  Pain severity:  Moderate  Onset quality:  Sudden  Duration:  1 day  Timing:  Constant  Progression:  Worsening  Chronicity:  New  Recent urinary tract infections: no    Relieved by:  None tried  Worsened by:  Nothing  Urinary symptoms: discolored urine, frequent urination and hematuria    Urinary symptoms: no foul-smelling urine, no hesitancy and no bladder incontinence    Associated symptoms: no abdominal pain, no fever, no flank pain, no genital lesions, no nausea, no vaginal discharge and no vomiting    Risk factors: no sexually transmitted infections    Requesting STD screening, denies exposures.    Medical/Surgical/Family History     History reviewed. No pertinent past medical history.     There is no problem list on file for this patient.           History reviewed. No pertinent surgical history.  No family history on file.       Social History     Tobacco Use    Smoking status: Not on file    Smokeless tobacco: Not on file   Substance Use Topics    Alcohol use: Not on file    Drug use: Not on file     Living Situation     Questions Responses    Patient lives with     Homeless     Caregiver for other family member     External Services     Employment     Domestic Violence Risk                 Review of Systems   Review of Systems   Constitutional: Negative for chills, diaphoresis and fever.   Gastrointestinal: Negative for abdominal pain, nausea and vomiting.   Genitourinary: Positive for dysuria, frequency, hematuria, pelvic pain and urgency. Negative for flank pain, genital sores, vaginal bleeding, vaginal discharge and vaginal pain.   Skin: Negative for rash.       Physical  Exam   Triage Vitals      First Recorded BP: 121/72 Oxygen Therapy SpO2: 98 %, O2 Device: None (Room air),    .      Physical Exam  Vitals and nursing note reviewed.   Constitutional:       General: She is not in acute distress.     Appearance: She is well-developed.   HENT:      Head: Normocephalic and atraumatic.   Pulmonary:      Effort: Pulmonary effort is normal. No respiratory distress.   Abdominal:      General: Bowel sounds are normal. There is no distension.      Palpations: Abdomen is soft. There is no mass.      Tenderness: There is abdominal tenderness in the suprapubic area. There is no right CVA tenderness, left CVA tenderness, guarding or rebound.   Skin:     General: Skin is warm and dry.   Neurological:      Mental Status: She is alert and oriented to person, place, and time.  Psychiatric:         Mood and Affect: Mood normal.         Behavior: Behavior normal.         Thought Content: Thought content normal.         Judgment: Judgment normal.          Medical Decision Making        Medical Decision Making  Assessment:    Brittney Dougherty is a 24 y.o. female who presents with complaints of dysuria, frequency, urgency x 1 day; requesting STD testing.  Patient was alert and oriented, vital signs and nursing triage note reviewed.     Differential diagnosis:    STI, pregnancy, UTI, cystitis      Plan and Results:    Pregnancy - Urine Pregnancy test ordered to guide diagnostics and/or medication therapy in patient of child bearing age.        Orders Placed This Encounter    Bacterial urine culture    Chlamydia plasmid DNA amplification    N. Gonorrhoeae DNA amplification    Trichomonas DNA amplification    POCT urine pregnancy    POCT urinalysis dipstick    nitrofurantoin monohydrate macrocrystal (MACROBID) 100 MG capsule    phenazopyridine (PYRIDIUM) 200 MG tablet       Recent Results (from the past 24 hour(s))   POCT urine pregnancy    Collection Time: 08/24/20  7:16 PM   Result Value Ref  Range    Preg Test,UR POC Negative Negative-Dilute urine specimens may cause false negative urine pregnancy results...    INTERNAL CONTROL POCT URINE PREGNANCY *Yes-internal procedural control(s) acceptable     Exp date 08/2021     Lot # [id number]    POCT urinalysis dipstick    Collection Time: 08/24/20  7:20 PM   Result Value Ref Range    Specific gravity,UA POCT 1.030 1.002 - 1.030    PH,UA POCT 5.0 5 - 8    Leuk Esterase,UA POCT +2 (!) Negative    Nitrite,UA POCT Positive (!) Negative    Protein,UA POCT Trace (!) Negative mg/dL    Glucose,UA POCT Normal Normal mg/dL    Ketones,UA POCT ++ moderate (!) Negative mg/dL    Urobilinogen,UA Normal Less than 1 mg/dL    Bilirubin,Ur Negative Negative, Test Not Performed    Blood,UA POCT About 250 (!) Negative    Exp date 08/2021     Lot # [id number]           Final Diagnosis    ICD-10-CM ICD-9-CM   1. Acute cystitis with hematuria  N30.01 595.0         Discharge Instructions       Drink plenty of fluids. Get plenty of rest. Use good hand hygiene.       Please start the new medications as below:  Current Discharge Medication List      New Medications    Details Last Dose Given Next Dose Due Script Given?   nitrofurantoin monohydrate macrocrystal (MACROBID) 100 mg Dose: 100 mg  Take 100 mg by mouth 2 times daily   Quantity 14 capsule, Refill 0  Start date: 08/24/2020, End date: 08/31/2020       Comments: Emergency Encounter            phenazopyridine (PYRIDIUM) 200 mg Dose: 200 mg  Take 200 mg by mouth 3 times daily as needed for Pain   Quantity 6 tablet, Refill 0  Start date: 08/24/2020, End date: 08/26/2020  Comments: Emergency Encounter                 Please follow up with your physician as below:   Follow-up Information     [facility] [place] In 2 days.    Specialty: Emergency Medicine  Why: If symptoms worsen, If symptoms do not improve  Contact information:  [address] [place] [address]  [phone]              If short of breath, chest  pains, worsening symptoms, or any other concerns please report to the emergency room.    In the event of an Emergency dial 911.      Final Diagnosis  Final diagnoses:   [N30.01] Acute cystitis with hematuria (Primary)         Concha Pyo, PA              Alysia Penna [place], [place]  08/24/20 1930

## 2020-08-27 ENCOUNTER — Telehealth: Payer: Self-pay | Admitting: Emergency Medicine

## 2020-08-27 LAB — AEROBIC CULTURE

## 2020-08-27 MED ORDER — SULFAMETHOXAZOLE-TRIMETHOPRIM 800-160 MG PO TABS *I*
1.0000 | ORAL_TABLET | Freq: Two times a day (BID) | ORAL | 0 refills | Status: AC
Start: 2020-08-27 — End: 2020-09-01

## 2020-08-27 NOTE — Telephone Encounter (Signed)
[facility] ED/UC Post-Visit Patient Call     Visit location: [place] [facility]                     Date of service: 08/24/2020    Contact made: 08/27/2020    Results of Post-Visit Patient Call: patient agrees to briefly discuss recent visit     If we provided you with a prescription were you able to fill it?:yes    If testing was ordered, were results discussed? Yes     Discussed urine culture results. Patient reports symptoms are improving however not completely resolved at this time. As culture shows intermediate sensitivity to the Macrobid she is on discussed changing her antibiotic to one that the organism is shown to be sensitive to. Prescription for Bactrim sent to her pharmacy and was instructed to stop Macrobid and start Bactrim at this time. She is aware there are still further tests pending at this time.     If we provided you with a referral, were you able to make an appointment?:N/A    Do you have any questions for the RN/Provider regarding your care?: No    Do you have any concerns or would you like to be contacted regarding your visit?:No    Berdine Addison, NP on 08/27/2020 at 11:38 AM       Luther Hearing Lorel Monaco, NP  08/27/20 1140

## 2020-08-28 ENCOUNTER — Telehealth: Payer: Self-pay | Admitting: Emergency Medicine

## 2020-08-28 LAB — VAGINITIS SCREEN: DNA PROBE: Vaginitis Screen:DNA Probe: POSITIVE — AB

## 2020-08-28 NOTE — Telephone Encounter (Signed)
Informed of positive BV. rx flagyl. No alcohol on this.      Brittney Dougherty, [place]  08/28/20 [phone]

## 2020-08-29 LAB — N. GONORRHOEAE DNA AMPLIFICATION: N. gonorrhoeae DNA Amplification: 0

## 2020-08-29 LAB — CHLAMYDIA PLASMID DNA AMPLIFICATION: Chlamydia Plasmid DNA Amplification: 0

## 2020-08-29 LAB — TRICHOMONAS DNA AMPLIFICATION: Trichomonas DNA amplification: 0

## 2020-08-31 ENCOUNTER — Telehealth: Payer: Self-pay | Admitting: General Orthopedics

## 2020-08-31 MED ORDER — METRONIDAZOLE 500 MG PO TABS *I*
500.0000 mg | ORAL_TABLET | Freq: Two times a day (BID) | ORAL | 0 refills | Status: AC
Start: 2020-08-31 — End: 2020-09-07

## 2020-08-31 NOTE — Telephone Encounter (Signed)
Patient called as her prescription for BV was not sent. Metronidazole was sent for her. We discussed no alcohol while on this medication. Patient expressed gratitude.    Earney Navy, PA       [place], [place]  08/31/20 2021

## 2020-12-04 ENCOUNTER — Ambulatory Visit: Payer: PRIVATE HEALTH INSURANCE

## 2020-12-04 ENCOUNTER — Other Ambulatory Visit
Admission: RE | Admit: 2020-12-04 | Discharge: 2020-12-04 | Disposition: A | Discharge status: Home / Self Care | Payer: PRIVATE HEALTH INSURANCE | Source: Ambulatory Visit | Attending: Obstetrics | Admitting: Obstetrics

## 2020-12-04 DIAGNOSIS — Z20828 Contact with and (suspected) exposure to other viral communicable diseases: Secondary | ICD-10-CM | POA: Insufficient documentation

## 2020-12-04 DIAGNOSIS — Z20822 Contact with and (suspected) exposure to covid-19: Secondary | ICD-10-CM | POA: Insufficient documentation

## 2020-12-05 LAB — COVID-19 NAAT (PCR): COVID-19 NAAT (PCR): NEGATIVE

## 2020-12-05 LAB — COVID-19 PCR

## 2021-02-13 LAB — TREPONEMA PALLIDUM (SYPHILIS) ANTIBODY (HT): Syphilis Treponemal Ab (HT): NONREACTIVE

## 2021-02-13 LAB — UNMAPPED LAB RESULTS: HIV 1&2 ANTIGEN/ANTIBODY (HT): NONREACTIVE

## 2021-08-22 ENCOUNTER — Ambulatory Visit
Admission: AD | Admit: 2021-08-22 | Discharge: 2021-08-22 | Disposition: A | Discharge status: Home / Self Care | Payer: 59 | Source: Ambulatory Visit | Attending: Emergency Medicine | Admitting: Emergency Medicine

## 2021-08-22 DIAGNOSIS — Z113 Encounter for screening for infections with a predominantly sexual mode of transmission: Secondary | ICD-10-CM | POA: Insufficient documentation

## 2021-08-22 DIAGNOSIS — A5602 Chlamydial vulvovaginitis: Secondary | ICD-10-CM | POA: Insufficient documentation

## 2021-08-22 DIAGNOSIS — N898 Other specified noninflammatory disorders of vagina: Secondary | ICD-10-CM | POA: Insufficient documentation

## 2021-08-22 DIAGNOSIS — Z3202 Encounter for pregnancy test, result negative: Secondary | ICD-10-CM | POA: Insufficient documentation

## 2021-08-22 LAB — POCT URINALYSIS DIPSTICK
Bilirubin,Ur: NEGATIVE
Exp date: 9
Glucose,UA POCT: NORMAL mg/dL
Ketones,UA POCT: NEGATIVE mg/dL
Leuk Esterase,UA POCT: 2 — AB
Lot #: 53600802
Nitrite,UA POCT: NEGATIVE
PH,UA POCT: 5 (ref 5–8)
Specific gravity,UA POCT: 1.015 (ref 1.002–1.030)
Urobilinogen,UA: NORMAL mg/dL

## 2021-08-22 LAB — POCT URINE PREGNANCY
Exp date: 10
Lot #: 185235
Preg Test,UR POC: NEGATIVE

## 2021-08-22 NOTE — UC Provider Note (Signed)
History     Chief Complaint   Patient presents with    Sexually Transmitted Diseases     Patient reports vaginal discharge - sometimes yellow or green, no known exposure     Patient is a 25 year old female here today for evaluation of increased vaginal discharge. She is currently in a new relationship with a new partner but has not had sex. She says last intercourse was a few months ago. She has not noticed any odor, No vaginal itching, no pain with urination. She did recently finish her menses          Medical/Surgical/Family History     History reviewed. No pertinent past medical history.     There is no problem list on file for this patient.           Past Surgical History:   Procedure Laterality Date    FRACTURE SURGERY      left femur     No family history on file.       Social History     Tobacco Use    Smoking status: Not on file    Smokeless tobacco: Not on file   Substance Use Topics    Alcohol use: Not on file    Drug use: Not on file     Living Situation     Questions Responses    Patient lives with     Homeless     Caregiver for other family member     External Services     Employment     Domestic Violence Risk                 Review of Systems   Review of Systems   Constitutional: Negative for chills and fever.   Genitourinary: Positive for vaginal discharge. Negative for dysuria, frequency, genital sores, menstrual problem and vaginal pain.       Physical Exam   Vital Signs  Vitals:    08/22/21 1154   BP: 115/73   Pulse: 60   Resp: 18   Temp: 36.3 C (97.3 F)   SpO2: 99%         Physical Exam  Vitals reviewed.   Constitutional:       General: She is not in acute distress.     Appearance: Normal appearance.   Cardiovascular:      Rate and Rhythm: Normal rate and regular rhythm.      Pulses: Normal pulses.   Abdominal:      General: Abdomen is flat.      Tenderness: There is no abdominal tenderness. There is no right CVA tenderness or left CVA tenderness.   Neurological:      Mental Status:  She is alert.   Psychiatric:         Mood and Affect: Mood normal.      Pregnancy - Urine Pregnancy test ordered to guide diagnostics and/or medication therapy in patient of child bearing age    Medical Decision Making        Medical Decision Making  Assessment:    25 year old female with several days of increased vaginal discharge    Differential diagnosis:    BV  Chlamydia  Gonorrhea  Trichomonas  Candida  UTI    Plan and Results:     Encounter orders    Orders Placed This Encounter      Chlamydia plasmid DNA amplification      N. Gonorrhoeae DNA amplification  Vaginitis screen: DNA probe      POCT urine pregnancy      POCT urinalysis dipstick    Lab results   Recent Results (from the past 24 hour(s))  -POCT urinalysis dipstick:   Collection Time: 08/22/21 12:01 PM       Result                                            Value                                                    Specific gravity,UA POCT                          1.015                                                    PH,UA POCT                                        5.0                                                      Leuk Esterase,UA POCT                             +2 (!)                                                   Nitrite,UA POCT                                   Negative                                                 Protein,UA POCT                                   Trace (!)                                                Glucose,UA POCT  Normal                                                   Ketones,UA POCT                                   Negative                                                 Urobilinogen,UA                                   Normal                                                   Bilirubin,Ur                                      Negative                                                 Blood,UA POCT                                     Trace (!)                                                 Exp date                                          09 22                                                    Lot #                                             [id number]                                              Diagnosis and Disposition:       Return precautions discussed and provided on  AVS.    Discharge Medications  There are no discharge medications for this patient.        Follow-up            Final Diagnosis  Final diagnoses:   [N89.8] Vaginal discharge (Primary)         Delorise Jackson, MD              Delorise Jackson, MD  08/22/21 [phone]

## 2021-08-22 NOTE — ED Triage Notes (Signed)
Patient reports vaginal discharge - sometimes yellow or green, no known exposure

## 2021-08-22 NOTE — Discharge Instructions (Signed)
I will send the urine and vaginal swabs to screen for any infections that would require treatments. We will notify you if treatment is indicated

## 2021-08-23 ENCOUNTER — Telehealth: Payer: Self-pay

## 2021-08-23 LAB — N. GONORRHOEAE DNA AMPLIFICATION: N. gonorrhoeae DNA Amplification: 0

## 2021-08-23 LAB — CHLAMYDIA PLASMID DNA AMPLIFICATION: Chlamydia Plasmid DNA Amplification: POSITIVE — AB

## 2021-08-23 LAB — VAGINITIS SCREEN: DNA PROBE: Vaginitis Screen:DNA Probe: POSITIVE — AB

## 2021-08-23 MED ORDER — AZITHROMYCIN 500 MG PO TABS *I*
1000.0000 mg | ORAL_TABLET | Freq: Once | ORAL | 0 refills | Status: AC
Start: 2021-08-23 — End: 2021-08-23

## 2021-08-23 MED ORDER — METRONIDAZOLE 0.75 % VA GEL *I*
1.0000 | Freq: Every evening | VAGINAL | 0 refills | Status: AC
Start: 2021-08-23 — End: 2021-08-28

## 2021-08-23 NOTE — Telephone Encounter (Signed)
Informed pt of chlamydia which is a std and Bv which is not. rx azithromycin and metrogel. No sex for 2 weeks. Contact other partners. All questions answered.      Brittney Dougherty, [place]  08/23/21 [phone]

## 2021-08-23 NOTE — Telephone Encounter (Signed)
Returning call.

## 2021-08-24 LAB — AEROBIC CULTURE: Aerobic Culture: 0

## 2021-11-22 ENCOUNTER — Telehealth: Payer: Self-pay

## 2021-11-22 DIAGNOSIS — Z3200 Encounter for pregnancy test, result unknown: Secondary | ICD-10-CM

## 2021-11-22 NOTE — Telephone Encounter (Signed)
Incoming call from pt looking to start prenatal care   11/03/21?/+HPT

## 2021-12-06 ENCOUNTER — Other Ambulatory Visit: Payer: Self-pay

## 2021-12-06 ENCOUNTER — Encounter: Payer: Self-pay | Admitting: Emergency Medicine

## 2021-12-06 ENCOUNTER — Ambulatory Visit: Payer: 59 | Attending: Emergency Medicine | Admitting: Emergency Medicine

## 2021-12-06 VITALS — BP 114/60 | HR 71 | Temp 97.0°F | Resp 14

## 2021-12-06 DIAGNOSIS — N939 Abnormal uterine and vaginal bleeding, unspecified: Secondary | ICD-10-CM | POA: Insufficient documentation

## 2021-12-06 DIAGNOSIS — Z3201 Encounter for pregnancy test, result positive: Secondary | ICD-10-CM | POA: Insufficient documentation

## 2021-12-06 LAB — POCT URINE PREGNANCY
Lot #: 196348
Preg Test,UR POC: POSITIVE — AB

## 2021-12-06 LAB — POCT URINALYSIS DIPSTICK
Bilirubin,Ur: NEGATIVE
Glucose,UA POCT: NORMAL mg/dL
Ketones,UA POCT: NEGATIVE mg/dL
Leuk Esterase,UA POCT: 2 — AB
Lot #: 62072801
Nitrite,UA POCT: NEGATIVE
PH,UA POCT: 6 (ref 5–8)
Protein,UA POCT: NEGATIVE mg/dL
Specific gravity,UA POCT: 1.02 (ref 1.002–1.030)
Urobilinogen,UA: NORMAL mg/dL

## 2021-12-06 NOTE — Patient Instructions (Addendum)
Follow up in the emergency department if:   You have pain or cramping in your abdomen or low back.     You have heavy vaginal bleeding or clotting.     You pass material that looks like tissue or large clots. Collect the material and bring it with you.    CALL YOUR OB/GYN FOR FOLLOW UP.

## 2021-12-06 NOTE — UC Provider Note (Signed)
History     Chief Complaint   Patient presents with    Vaginal Bleeding     Pt reports that she had three positive pregnancy tests in dec each one wk apart and that she also missed her menses that was due 11/28/21. Pt states that today she noticed "pink blood mixed with mucous" when wiping that is not requiring pad usage at this time. Pt denies abd/pelvic pain.         History provided by:  Patient  Language interpreter used: No    Vaginal Bleeding  Quality:  Spotting  Severity:  Mild  Onset quality:  Sudden  Duration:  1 day  Timing:  Intermittent  Chronicity:  New  Prior pregnancy: yes    Prenatal care:  No prenatal care  Relieved by:  None tried  Worsened by:  Nothing  Associated symptoms: no abdominal pain, no back pain, no dizziness, no dyspareunia, no dysuria, no fatigue, no fever, no nausea and no vaginal discharge    Patient reports missed menses on 12/28. Several positive home pregnancy tests in December. Reports light vaginal spotting today - no symptomatic complaints.    Medical/Surgical/Family History     History reviewed. No pertinent past medical history.     There is no problem list on file for this patient.           Past Surgical History:   Procedure Laterality Date    FRACTURE SURGERY      left femur     No family history on file.          Living Situation     Questions Responses    Patient lives with     Homeless     Caregiver for other family member     External Services     Employment     Domestic Violence Risk                 Review of Systems   Review of Systems   Constitutional: Negative for fatigue and fever.   Gastrointestinal: Negative for abdominal pain and nausea.   Genitourinary: Positive for menstrual problem and vaginal bleeding. Negative for dyspareunia, dysuria, flank pain, frequency, pelvic pain, urgency, vaginal discharge and vaginal pain.   Musculoskeletal: Negative for back pain.   Neurological: Negative for dizziness.       Physical Exam   Vitals      First Recorded BP:  114/60, Resp: 14, Temp: 36.1 C (97 F), Temp src: TEMPORAL Oxygen Therapy SpO2: 100 %, Heart Rate: 71, (12/06/21 1542)  .      Physical Exam  Vitals and nursing note reviewed. Exam conducted with a chaperone present (S. Hrnjicevic, RN).   Constitutional:       Appearance: She is well-developed.   HENT:      Head: Normocephalic and atraumatic.   Cardiovascular:      Pulses: Normal pulses.   Pulmonary:      Effort: Pulmonary effort is normal.   Abdominal:      Tenderness: There is no abdominal tenderness.   Genitourinary:     Vagina: Normal. No bleeding.      Cervix: Discharge (yellow discharge with trace pink discoloration) present. No cervical motion tenderness, erythema or cervical bleeding.   Skin:     General: Skin is warm and dry.      Findings: No rash.   Neurological:      Mental Status: She is alert and oriented to person, place, and  time.   Psychiatric:         Mood and Affect: Mood normal.         Behavior: Behavior normal.         Thought Content: Thought content normal.         Judgment: Judgment normal.          Medical Decision Making   Medical Decision Making  Assessment:    Brittney Dougherty is a 26 y.o. female who presents with complaints of vaginal spotting today, confirmed pregnancy with home tests. missed menses on 12/28. On exam, there is no active vaginal bleeding; pink discoloration noted to vaginal discharge.  Patient was alert and oriented, vital signs and nursing triage note reviewed.     Differential diagnosis:    Pregnancy, spontaneous abortion, implantation bleeding, STI, vaginitis, UTI    Plan and Results:    Pregnancy - Urine Pregnancy test ordered to guide diagnostics and/or medication therapy in patient of child bearing age         Orders Placed This Encounter    Aerobic culture    Chlamydia plasmid DNA amplification    N. Gonorrhoeae DNA amplification    Vaginitis screen: DNA probe    POCT urinalysis dipstick    POCT urine pregnancy       Recent Results (from the past 24  hour(s))   POCT urinalysis dipstick    Collection Time: 12/06/21  3:47 PM   Result Value Ref Range    Specific gravity,UA POCT 1.020 1.002 - 1.030    PH,UA POCT 6.0 5 - 8    Leuk Esterase,UA POCT +2 (!) Negative    Nitrite,UA POCT Negative Negative    Protein,UA POCT Negative Negative mg/dL    Glucose,UA POCT Normal Normal mg/dL    Ketones,UA POCT Negative Negative mg/dL    Urobilinogen,UA Normal Less than 1 mg/dL    Bilirubin,Ur Negative Negative, Test Not Performed    Blood,UA POCT Test Not Performed (!) Negative    Exp date 9/23     Lot # [id number]    POCT urine pregnancy    Collection Time: 12/06/21  3:48 PM   Result Value Ref Range    Preg Test,UR POC Positive (!) Negative-Dilute urine specimens may cause false negative urine pregnancy results...    INTERNAL CONTROL POCT URINE PREGNANCY *Yes-internal procedural control(s) acceptable     Exp date 10/23     Lot # [id number]             Final Diagnosis    ICD-10-CM ICD-9-CM   1. Vaginal spotting  N93.9 623.8   2. Positive urine pregnancy test  Z32.01 V72.42       Patient Instructions   Follow up in the emergency department if:    You have pain or cramping in your abdomen or low back.      You have heavy vaginal bleeding or clotting.      You pass material that looks like tissue or large clots. Collect the material and bring it with you.    CALL YOUR OB/GYN FOR FOLLOW UP.      Follow up in about 1 day (around 12/07/2021) for in the ED if your symptoms worsen.    See AVS for further discharge instructions and recommendations.    If short of breath, chest pains, worsening symptoms, or any other concerns please report to the emergency room.    In the event of an Emergency dial 911.     Final Diagnosis  ICD-10-CM ICD-9-CM   1. Vaginal spotting  N93.9 623.8   2. Positive urine pregnancy test  Z32.01 V72.42         Concha Pyo, PA

## 2021-12-07 LAB — N. GONORRHOEAE NAAT (PCR): N. gonorrhoeae DNA Amplification: 0

## 2021-12-07 LAB — CHLAMYDIA NAAT (PCR): Chlamydia Plasmid DNA Amplification: 0

## 2021-12-08 ENCOUNTER — Telehealth: Payer: Self-pay | Admitting: Emergency Medicine

## 2021-12-08 LAB — VAGINITIS SCREEN: DNA PROBE: Vaginitis Screen:DNA Probe: POSITIVE — AB

## 2021-12-08 LAB — AEROBIC CULTURE: Aerobic Culture: 0

## 2021-12-08 MED ORDER — METRONIDAZOLE 500 MG PO TABS *I*
500.0000 mg | ORAL_TABLET | Freq: Two times a day (BID) | ORAL | 0 refills | Status: AC
Start: 2021-12-08 — End: 2021-12-15

## 2021-12-08 NOTE — Telephone Encounter (Signed)
Informed of positive BV. rx flagyl. Pt will not drink alcohol. Not std  Questions answered.

## 2021-12-25 LAB — PRENATAL PROFILE (HT)
Basophil # (HT): 0 10 3/uL (ref 0.0–0.2)
Basophil % (HT): 0 % (ref 0–2)
Eosinophil # (HT): 0 10 3/uL (ref 0.0–0.5)
Eosinophil % (HT): 0 % (ref 0–7)
HBV S Ag (HT): NEGATIVE
Hematocrit (HT): 38 % (ref 34–47)
Hemoglobin (HGB) (HT): 13.1 g/dL (ref 11.5–16.0)
Lymphocyte # (HT): 1.3 10 3/uL (ref 0.9–3.8)
Lymphocyte % (HT): 20 % (ref 17–44)
MCHC (HT): 34.2 g/dL (ref 32.0–36.0)
MCV (HT): 88.2 fL (ref 81.0–99.0)
Mean Corpuscular Hemoglobin (MCH) (HT): 30.2 pg (ref 26.0–34.0)
Monocyte # (HT): 0.5 10 3/uL (ref 0.2–1.0)
Monocyte % (HT): 8 % (ref 4–12)
Neutrophil # (HT): 4.6 10 3/uL (ref 1.5–7.7)
Platelets (HT): 237 10 3/uL (ref 140–400)
RBC (HT): 4.34 10 6/uL (ref 3.80–5.20)
RDW (HT): 12.2 % (ref 11.5–15.0)
Rubella IgG AB (HT): 69.8 (ref >9.9)
Seg Neut % (HT): 71 % (ref 40–75)
Syphilis Treponemal Ab (HT): NONREACTIVE
WBC (HT): 6.4 10 3/uL (ref 4.0–10.8)

## 2021-12-25 LAB — UNMAPPED LAB RESULTS
HIV 1&2 ANTIGEN/ANTIBODY (HT): NONREACTIVE
Hep C Ab: NEGATIVE

## 2022-05-31 LAB — UNMAPPED LAB RESULTS
Hematocrit (HT): 29 % — ABNORMAL LOW (ref 34–47)
Hemoglobin (HGB) (HT): 10.3 g/dL — ABNORMAL LOW (ref 11.5–16.0)
MCHC (HT): 35.6 g/dL (ref 32.0–36.0)
MCV (HT): 88.4 fL (ref 81.0–99.0)
Mean Corpuscular Hemoglobin (MCH) (HT): 31.5 pg (ref 26.0–34.0)
Platelets (HT): 190 10 3/uL (ref 140–400)
RBC (HT): 3.27 10 6/uL — ABNORMAL LOW (ref 3.80–5.20)
RDW (HT): 12.9 % (ref 11.5–15.0)
WBC (HT): 9.1 10 3/uL (ref 4.0–10.8)

## 2022-06-02 LAB — UNMAPPED LAB RESULTS: HIV 1&2 ANTIGEN/ANTIBODY (HT): NONREACTIVE

## 2022-08-17 LAB — UNMAPPED LAB RESULTS
ABO RH Blood Type (HT): O POS
Antibody Screen (HT): NEGATIVE
Hematocrit (HT): 34 % — ABNORMAL LOW (ref 40–52)
Hemoglobin (HGB) (HT): 11.2 g/dL — ABNORMAL LOW (ref 11.5–16.0)
MCHC (HT): 32.9 g/dL (ref 32.0–36.0)
MCV (HT): 91 fL (ref 81–99)
Mean Corpuscular Hemoglobin (MCH) (HT): 30 pg (ref 26.0–34.0)
Platelets (HT): 161 10 3/uL (ref 140–400)
RBC (HT): 3.73 10 6/uL — ABNORMAL LOW (ref 3.80–5.20)
RDW (HT): 14.1 % (ref 11.5–15.0)
WBC (HT): 8.1 10 3/uL (ref 4.0–10.0)

## 2022-08-17 LAB — TREPONEMA PALLIDUM (SYPHILIS) ANTIBODY (HT): Syphilis Treponemal Ab (HT): NONREACTIVE

## 2023-02-10 ENCOUNTER — Telehealth: Payer: Self-pay

## 2023-02-10 NOTE — Telephone Encounter (Addendum)
TOP Appointment Request    Date of LMP: 2.8.24   Do you have insurance: yes  If yes, Type of Insurance: [employer]   Insurance ID#: [id number]  SS# (if Medicaid plan): [id number]  Have you had an Ultrasound?  no  if yes WHERE? Please provide [facility] fax # [phone] to have copy faxed if not in e-record.  What is the best phone number for a call back: [phone]   Is it OK to leave a voicemail: yes    If the patient does NOT have medicaid please send to [place] [facility]        FYI: Patient connected to [facility] (Renata) to update the patient's insurance in her chart.

## 2023-02-10 NOTE — Telephone Encounter (Signed)
Patient has active Medicaid. CIN# [id number], $49

## 2023-02-11 ENCOUNTER — Telehealth: Payer: Self-pay

## 2023-02-11 NOTE — Telephone Encounter (Signed)
Writer contacted patient, Spoke with patient, Patient scheduled for Medical TOP on 02/27/2023 at 930am. Patient aware of visitor policy and ID policy

## 2023-02-11 NOTE — Telephone Encounter (Signed)
Date:02/11/2023;  Time:9:20am   Recorded By: [place]    Patient's Name: Brittney Dougherty   Patient Age: 27 y.o.   Patient's Address:  [place] [address]        DOB: [birth year]   Pronouns-   Current county? [place] and state of BIRTH: [place]   Birth last name/maiden name if different than current last name? Pons    Last 4 digits of social? 11   Race: Black  Hispanic Origin  Yes - [place]   Highest level of education completed? Bachelors Degree     CONTACT INFO:  Phone:    (c) number: [phone]  Message OK? Yes and No                [facility] Patient? No  Referring Provider?     Insurance: Medicaid   Ins/Contract #: [id number]   Ins Verified: 02/10/2023     LMP: 01/09/2023  EGA: [redacted]W[redacted]D on  02/27/2023   No LMP recorded.   BMI: 40.3    1st Trimester:  Yes  2nd Trimester:  No  Genetic Referral:  No    OB/GYN History: G 3  P 1  OB History    No obstetric history on file.        Previous Vaginal deliveries? 1  Previous C/S?   Previous TOP?: 1  Previous SAb?   Date of first live birth:08/2022   Date of last live birth:  Date of last termination:10/2020    Significant Medical History? None   No past medical history on file.   Significant Surgical Hx  Past Surgical History:   Procedure Laterality Date    FRACTURE SURGERY      left femur       Current drug use? None     Not a candidate for procedure room if:    ? BMI greater than 40  ? Active street drug use  ? Current suboxone/subutex/methadone use    Needs to be reviewed with provider prior to scheduling if:  ? Cardiac/Pulmonary issues  ? Diabetes on insulin  ? Severe psychiatric disease  ? BMI greater than 50    Allergies: none   No Known Allergies (drug, envir, food or latex)     Current Meds: None     Where was your pregnancy diagnosed? HPT   Have you been seen anywhere else for your pregnancy? no   Have you been seen in a hospital, urgent care or emergency room for your pregnancy? no  Have you received care at any  of the following clinics:   [facility] no  [facility] no  [facility] no  [facility] no  [facility] no  Other Private Doctor including Family Medicine, Internal Medicine, or Pediatrics no    Have you had an Ultrasound for this pregnancy? no  If so, where?   What did they tell you about the Ultrasound?     Date of ultrasound:     1st Trimester Appt Scheduled for: 02/27/2023  2nd Trimester Appt Scheduled for:    OR Date:     Reviewed transportation policy: No  Reviewed NPO for same day procedures: No  Reviewed dilator placement (GA-16 weeks and over): No      Postop Appt/Location: Will only make if necessary.

## 2023-02-27 ENCOUNTER — Ambulatory Visit: Payer: Medicaid Other

## 2023-02-27 ENCOUNTER — Other Ambulatory Visit: Payer: Self-pay

## 2023-02-27 ENCOUNTER — Ambulatory Visit
Payer: Medicaid Other | Attending: Reproductive Endocrinology and Infertility | Admitting: Reproductive Endocrinology and Infertility

## 2023-02-27 ENCOUNTER — Encounter: Payer: Self-pay | Admitting: Gastroenterology

## 2023-02-27 VITALS — BP 136/60 | HR 75 | Ht 64.0 in | Wt 245.0 lb

## 2023-02-27 DIAGNOSIS — Z3490 Encounter for supervision of normal pregnancy, unspecified, unspecified trimester: Secondary | ICD-10-CM

## 2023-02-27 DIAGNOSIS — Z113 Encounter for screening for infections with a predominantly sexual mode of transmission: Secondary | ICD-10-CM | POA: Insufficient documentation

## 2023-02-27 DIAGNOSIS — Z332 Encounter for elective termination of pregnancy: Secondary | ICD-10-CM | POA: Insufficient documentation

## 2023-02-27 DIAGNOSIS — Z3687 Encounter for antenatal screening for uncertain dates: Secondary | ICD-10-CM

## 2023-02-27 DIAGNOSIS — Z3009 Encounter for other general counseling and advice on contraception: Secondary | ICD-10-CM | POA: Insufficient documentation

## 2023-02-27 LAB — N. GONORRHOEAE NAAT (PCR): N. gonorrhoeae NAAT (PCR): NEGATIVE

## 2023-02-27 LAB — CHLAMYDIA NAAT (PCR): Chlamydia NAAT (PCR): NEGATIVE

## 2023-02-27 LAB — TRICHOMONAS NAAT (PCR): Trichomonas NAAT (PCR): NEGATIVE

## 2023-02-27 MED ORDER — IBUPROFEN 600 MG PO TABS *I*
600.0000 mg | ORAL_TABLET | Freq: Three times a day (TID) | ORAL | 0 refills | Status: AC | PRN
Start: 2023-02-27 — End: ?

## 2023-02-27 MED ORDER — NORETHINDRONE ACET-ETHINYL EST 1.5-30 MG-MCG PO TABS
1.0000 | ORAL_TABLET | Freq: Every day | ORAL | 4 refills | Status: AC
Start: 2023-02-27 — End: 2023-03-27

## 2023-02-27 MED ORDER — ONDANSETRON 4 MG PO TBDP *I*
4.0000 mg | ORAL_TABLET | Freq: Three times a day (TID) | ORAL | 0 refills | Status: AC | PRN
Start: 2023-02-27 — End: 2023-03-02

## 2023-02-27 NOTE — Progress Notes (Signed)
CHIEF COMPLAINT  Brittney Dougherty presents with an unplanned pregnancy     27 y.o. G3P1011 at  7 weeks by LMP 01/09/23.  She had a SVD in September 2023 and was breastfeeding and taking the mini-pill.  She stopped breastfeeding and continued with the mini-pill.  She admits that she took some doses late.     HISTORY OF PRESENT ILLNESS   When she first suspected pregnancy: earlier this month when her period didn't come  When she was diagnosed pregnant:: UPT at home  Was she using contraception?: Mini-pill; admits to a late doses  She's told about the pregnancy: grandmother, sister, partner  She's told about the abortion decision: same  Who is her support for the abortion? same  She comes to the office today with: alone  Is she being coerced/forced by anyone to have the abortion? no  If anyone finds out about the abortion will she be in harm? no  Name of their most recent Gyn/PCP: [facility]     CONTRACEPTIVE HISTORY     Prior methods used:  Pills  Depo provera     MEDICATIONS     Current Outpatient Medications:     Norethindrone Acet-Ethinyl Est (JUNEL 1.5/30) 1.5-30 MG-MCG TABS, Take 1 tablet by mouth daily for 28 days, Disp: 84 each, Rfl: 4    ibuprofen (ADVIL,MOTRIN) 600 mg tablet, Take 1 tablet (600 mg total) by mouth 3 times daily as needed for Pain, Disp: 30 tablet, Rfl: 0    ondansetron (ZOFRAN-ODT) 4 MG disintegrating tablet, Take 1 tablet (4 mg total) by mouth 3 times daily as needed for up to 3 days Place on top of tongue., Disp: 15 tablet, Rfl: 0    ALLERGIES   No Known Allergies (drug, envir, food or latex)    MEDICAL HISTORY  History reviewed. No pertinent past medical history.    SURGICAL HISTORY  Past Surgical History:   Procedure Laterality Date    FRACTURE SURGERY      left femur    THERAPEUTIC ABORTION         PERSONAL HISTORY   Patient lives with : partner and 56 month old son  Education Level : college graduate  Working currently : Yes RN   Got pregnant with current partner? Yes          How long  with most recent sexual partner? 3 years  History of Domestic Violence with current partner? No    Social History     Socioeconomic History    Marital status: Single   Tobacco Use    Smoking status: Never    Smokeless tobacco: Never   Substance and Sexual Activity    Alcohol use: Not Currently    Drug use: Never    Sexual activity: Yes     Partners: Male     Birth control/protection: Pill       Family History   Problem Relation Age of Onset    Heart Disease Father        GYNECOLOGIC HISTORY  Menses:regular every 28-30 days    Menarche: 27 yo  STIs: chlamydia  Desire for full serum STI testing: yes  Last Pap was: 12/25/2021 NILM      OB History   Gravida Para Term Preterm AB Living   3 1 1  0 1 1   SAB IAB Ectopic Multiple Live Births   0 1 0 0 1      # Outcome Date GA Lbr Len/2nd Weight Sex Type  Anes PTL Lv   3 Current            2 Term 08/17/22 [redacted]w[redacted]d / 01:35 3625 g (7 lb 15.9 oz) M **SVD EPIDURAL-, Local N LIV      Name: Brittney Dougherty, Brittney Dougherty      Apgar1: 7  Apgar5: 8   1 IAB 10/2020              Birth Comments: D&C        She expresses that her main reason(s) for choosing the abortion:  She is not emotionally ready for another child     Options Counseling: The patient was counseled about her pregnancy options of parenthood, adoption and abortion.  She expressed her sure decision for abortion. Medical and surgical termination reviewed. Reviewed that a medical termination starts with mifepristone in the office.  Reviewed that a surgical termination starts at the time of misoprostol insertion if given for pre-op cervical preparation for a surgical procedure.  The termination consent form was reviewed with her and she signed it after all her questions were answered.      Blood Transfusion Consent: She was counseled on the low risk for blood transfusion with risk of transfusion reaction and infection such as HIV/hepatitis but was recommended to accept a transfusion in case of a life-threatening hemorrhage and she  accepts.    Contraceptive Counseling: The patient was counseled in detail about her contraceptive options.  She says she wishes to postpone another pregnancy for a few years.  In discussing her options, we focused on all options.  She has decided to use cOCPs for post-abortal contraception.     REVIEW OF SYSTEMS  General ROS: negative  Psychological ROS: negative  Respiratory ROS: no cough, shortness of breath, or wheezing  Cardiovascular ROS: no chest pain or dyspnea on exertion  Gastrointestinal ROS: no abdominal pain, change in bowel habits, or black or bloody stools  Genito-Urinary ROS: no dysuria, trouble voiding, or hematuria   Denies vb/discharge/odor/irritation.  Musculoskeletal ROS: negative      Vitals:    02/27/23 0928   BP: 136/60   Pulse: 75   Weight: 111.1 kg (245 lb)   Height: 1.626 m (5\' 4" )       PHYSICAL EXAM   General: Body mass index is 42.05 kg/m.       Coping well yes      Tearful. no  Mental Status: Alert, normal MS. Answers all questions appropriately.  Lungs: CTA bilaterally, no wheezes with forced expiration, respirations unlabored.  Heart: Regular rate without murmurs.  Abdomen: Soft, non-tender, no masses.         PELVIC EXAM:  External Genitalia: Urethral meatus and vulva normal in appearance  Internal Genitalia: Negative BSU, pink, rugatted, no discharge  Cervix: Midline, pink.  Os closed, no discharge, no CMT, no lesions  Vagina: Smooth pink walls, no lesions,  no discharge.  Uterus: anteverted, 8 weeks size  Adnexa: Normal   Pap done: No   GC/CT DNA Amplification: Yes      Utilizing a shared decision model, a pelvic exam was performed today as indicated by screening recommendations, medical history and/or symptoms.     Examination chaperoned by L.Paulla Fore, RN  Speculum exam code in charge capture [id number]) yes     TOP Office Ultrasound Results:    Transabdominal  was performed. The uterus appears anteverted. Fibroids were not seen.     1st trimester singleton intrauterine pregnancy with:  embryonic/fetal pole, yolk sac, and cardiac  activity seen.     Measurements:    CRL consistent with 7 weeks.    Right ovary:  normal  Left ovary: not seen    Impression: 1st trimester SLIUP consistent with 7 weeks.      ASSESSMENT  27 y.o. G3P1011 at [redacted]w[redacted]d by [facility]  Patient requests termination of pregnancy and consents to abortion.  Continued pregnancy is a threat to her emotional/psychological health.      PLAN  PreOp   O+; rhogam is not needed  STI screen: We will follow up on her vaginal NAAT swabs  Offered HIV/Syphilis/Hep B/Hep C  Verbal HIV consent obtained.  Postabortally, she plans to use cOCPs  Patient received an advance prescription for emergency contraception no.  Patient was given prescription for Ibuprofen 600 mg PO Q6-8 hrs prn.  Patient will return to [facility] as needed.    1st trimester Medical TOP:   After counseling about her options of medical or surgical abortion, she chooses medical abortion.  She signed the consent for medical abortion with mifepristone and misoprostol.  The instructions for the regimen were reviewed with her in detail and she was given a copy to take home.  Patient was given 200 mg of mifepristone (lot number: [id number], exp date: 10/2024) orally at 10:30 in the office.  Patient was given 800 mcg  of misoprostol for vaginal administration at home to be taken no earlier than 6 hours and no later than 72 hours after the mifepristone.  Patient was given an advance dose of 800 mcg misoprostol to be used at home if no bleeding after 24 hours or if heavy bleeding occurs  Patient was given a prescription for Zofran 4 mg PO Q 6 hr PRN  Patient was given a prescription for Ibuprofen 600 mg PO Q6-8 hours prn.  Patient will follow up in 1-2 weeks for an [place] to confirm MTOP completion    Sisto Granillo I Quetzalli Clos, NP    I personally spent 65 minutes on the calendar day of the encounter, including pre and post visit work, providing care for the above patient.

## 2023-02-27 NOTE — Invasive Procedure Plan of Care (Signed)
CONSENT FOR MEDICAL  OR SURGICAL PROCEDURE                            Patient Name: Brittney Dougherty  [facility] [address]                                                              DOB: 13-Mar-1996         Please read this form or have someone read it to you.   It's important to understand all parts of this form. If something isn't clear, ask [place] to explain.   When you sign it, that means you understand the form and give [place] permission to do this surgery or procedure.     I agree for Marguerita Merles, NP , and Ob/Gyn team (may be a resident, fellow or student) along with any assistants* they may choose, to treat the following condition(s): Undesired pregnancy   By doing this surgery or procedure on me: Ending a pregnancy with medications    This is also known as: Medical abortion using mifepristone and misoprostol   Laterality: Not applicable     *if you'd like a list of the assistants, please ask. We can give that to you.    1. The care provider has explained my condition to me. They have told me how the procedure can help me. They have told me about other ways of treating my condition. I understand the care provider cannot guarantee the result of the procedure. If I don't have this procedure, my other choices are: Surgical termination of pregnancy, continuation of pregnancy    2. The care provider has told me the risks (problems that can happen) of the procedure. I understand there may be unwanted results. The risks that are related to this procedure include: Failure of the medications to work and pregnancy continues, risk of birth defects if pregnancy continues, retained tissue in the uterus, need for surgery to complete the procedure, pain, bleeding - including severe bleeding that requires blood transfusion, infection, nausea, vomiting, diarrhea, need for urgent evaluation at the hospital    3. I understand that during the procedure, my care provider may find a condition that we didn't know about before the  treatment started. Therefore, I agree that my care provider can perform any other treatment which they think is necessary and available.    4. I give permission to the hospital and/or its departments to examine and keep tissue, blood, body parts, fluids or materials removed from my body during the procedure(s) to aid in diagnosis and treatment, after which they may be used for scientific research or teaching by appropriate persons. If these materials are used for science or teaching, my identity will be protected. I will no longer own or have any rights to these materials regardless of how they may be used.    5. My care provider might want a representative from a medical device company to be there during my procedure. I understand that person works for:          The ways they might help my care provider during my procedure include:            6. Here are my decisions about receiving blood,  blood products, or tissues. I understand my decisions cover the time before, during and after my procedure, my treatment, and my time in the hospital. After my procedure, if my condition changes a lot, my care provider will talk with me again about receiving blood or blood products. At that time, my care provider might need me to review and sign another consent form, about getting or refusing blood.    I understand that the blood is from the community blood supply. Volunteers donated the blood, the volunteers were screened for health problems. The blood was examined with very sensitive and accurate tests to look for hepatitis, HIV/AIDS, and other diseases. Before I receive blood, it is tested again to make sure it is the correct type.    My chances of getting a sickness from blood products are small. But no transfusion is 100% safe. I understand that my care provider feels the good I will receive from the blood is greater than the chances of something going wrong. My care provider has answered my questions about blood  products.      My decision  about blood or  blood products   Yes, I agree to receive blood or blood products if my care provider thinks they're needed.        My decision   about tissue  Implants     Not applicable.          I understand this  form.    My care provider  or his/her  assistants have  explained:   What I am having done and why I need it.  What other choices I can make instead of having this done.  The benefits and possible risks (problems) to me of having this done.  The benefits and possible risks (problems) to me of receiving transplants, blood, or blood products.  There is no guarantee of the results.  The care provider may not stay with me the entire time that I am in the operating or procedure room.  My provider has explained how this may affect my procedure. My provider has answered my  questions about this.         I give my  permission for  this surgery or  procedure.            _______________________________________________                                     My signature  (or parent or other person authorized to sign for you, if you are unable to sign for  yourself or if you are under 40 years old)        ______           Date        _____        Time   Electronic Signatures will display at the bottom of the consent form.    Care provider's statement: I have discussed the planned procedure, including the possibility for transfusion of blood  products or receipt of tissue as necessary; expected benefits; the possible complications and risks; and possible alternatives  and their benefits and risks with the patients or the patient's surrogate. In my opinion, the patient or the patient's surrogate  understands the proposed procedure, its risks, benefits and alternatives.              Electronically signed by: Marguerita Merles,  NP                                                02/27/2023         Date        9:57 AM        Time   Your doctor or someone your doctor has appointed has told you that  you may need blood or a blood product transfusion, which has been collected from volunteers, as part of your treatment as a patient.    The reasons you might need blood or blood products include, but are not limited to:   Significant loss of your own blood  Your body may not be getting enough oxygen to its tissues   Treatment of bleeding disorders caused by low platelet counts or platelets that do not work right (platelets are part of a cell that helps to form clots and keeps you from bleeding too much).  You may not have enough of other substances that help your blood clot or stop you from bleeding more  The risks of getting a transfusion of blood or blood products include, but are not limited to:   Damage to the lungs  Difficulty breathing due to fluid in the lungs  The product may contain bacteria or rarely a virus (which includes HIV and Hepatitis)  Blood from the community blood supply has been collected from volunteer donors who have been screened for health risk. The blood has been tested for major blood transmitted disease, but no transfusion is 100% safe. The blood is tested with very sensitive and accurate tests to screen for hepatitis, AIDS, and other disease, which makes the risks very small.  You may have side effects from the transfusion (rash, fever, chills) or an allergic reaction  The transfusion increases your risks of getting infection or cancer coming back  The transfusion can increase the time you have to stay in the hospital  The transfusion can potentially cause death if the wrong blood is given or your body rejects the blood  Before blood is transfused, it is tested again to make sure it is the correct type  There are other options than getting blood or blood products from other people and they include:   Drugs which can decrease bleeding  Drugs which can cause your body to make more blood (used in elective procedures with advance notice)  Autologous (your own blood) donation (needs  pre-arrangement)  No transfusion  If you exercise your right to refuse to be transfused with blood or blood products; these things listed below, among others, could happen to you:  Your body may not get enough oxygen and suffer

## 2023-03-17 ENCOUNTER — Ambulatory Visit: Payer: Medicaid Other

## 2023-03-17 ENCOUNTER — Other Ambulatory Visit: Payer: Self-pay

## 2023-03-17 ENCOUNTER — Ambulatory Visit: Payer: Medicaid Other | Admitting: Obstetrics and Gynecology

## 2023-03-17 VITALS — BP 144/71 | HR 91 | Ht 64.0 in | Wt 243.0 lb

## 2023-03-17 DIAGNOSIS — Z09 Encounter for follow-up examination after completed treatment for conditions other than malignant neoplasm: Secondary | ICD-10-CM

## 2023-03-17 DIAGNOSIS — O039 Complete or unspecified spontaneous abortion without complication: Secondary | ICD-10-CM

## 2023-03-17 DIAGNOSIS — Z9889 Other specified postprocedural states: Secondary | ICD-10-CM

## 2023-03-17 NOTE — Progress Notes (Signed)
[facility]  Follow up after Medical TOP    Subjective:  Brittney Dougherty presents for follow-up visit 2.5 wk s/p medication abortion on 02/27/23 at 7w 3d with Sharyn Creamer, NP. She took mifepristone in the office and took misoprostol at home as instructed.    She notes that she had heavy bleeding with clots and cramping at first after taking misoprostol. This decreased after 3-4 days, and then got less and less.    Bleeding had decreased to brown spotting only, but then this morning she had an increase in bleeding - had to change her pad after 4 hours. States she had one clot that was the size of a quarter. Then bleeding decreased again to light / spotting.  Occasional mild cramping, hasn't needed pain medication.    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    No N/V or fever.    Contraception: she is taking Junel OCPs as prescribed. States it's going fine so far with the pill - she used them in the past "years ago."    She states she does not have HTN and her blood pressure is usually normal - will recheck. Her primary gyn provider is at [facility].      Objective:  VS: BP 144/71   Pulse 91   Ht 1.626 m (5\' 4" )   Wt 110.2 kg (243 lb)   LMP 01/09/2023   Breastfeeding Unknown   BMI 41.71 kg/m   General: healthy, well-appearing, NAD   Exam chaperone: M. Groff  Office ultrasound results:  Transvaginal ultrasound was performed. The uterus appears anteverted. The endometrial stripe appears primarily homogenous, with slight heterogenous area, measuring 8.63mm at maximum thickness. No gestational sac, yolk sac, or fetal pole present. This is consistent with a complete medication abortion.    Assessment and plan: Completed medical TOP.  Discussed that spotting can last ~4 weeks after mTOP and that next regular period should begin by 6wk after mTOP.   Reviewed warning s/sx and reasons to call.  Contraception: Junel OCPs. Discussed pill use, side  effects, and warning sx. Advised follow-up with her primary gyn provider as needed. She does not have diagnosis of hypertension at this time, though advised if she were to develop HTN in the future, that would be a reason to avoid estrogen-containing contraceptives.  Follow-up as needed. Pt's primary gyn clinic is: [facility].      [place], [place] 03/17/2023  [facility]  [phone]
